# Patient Record
Sex: Male | Born: 1995 | Race: Black or African American | Hispanic: No | Marital: Single | Smoking: Never smoker
Health system: Southern US, Community
[De-identification: ages and names within clinical notes are randomized; demographics above are authoritative.]

## PROBLEM LIST (undated history)

## (undated) DIAGNOSIS — F32A Depression, unspecified: Secondary | ICD-10-CM

## (undated) DIAGNOSIS — F419 Anxiety disorder, unspecified: Secondary | ICD-10-CM

## (undated) DIAGNOSIS — J45909 Unspecified asthma, uncomplicated: Secondary | ICD-10-CM

## (undated) DIAGNOSIS — F329 Major depressive disorder, single episode, unspecified: Secondary | ICD-10-CM

---

## 2016-09-10 ENCOUNTER — Encounter (HOSPITAL_COMMUNITY): Payer: Self-pay | Admitting: Emergency Medicine

## 2016-09-10 ENCOUNTER — Emergency Department (HOSPITAL_COMMUNITY): Payer: Self-pay

## 2016-09-10 ENCOUNTER — Emergency Department (HOSPITAL_COMMUNITY)
Admission: EM | Admit: 2016-09-10 | Discharge: 2016-09-10 | Disposition: A | Discharge status: Home / Self Care | Payer: Self-pay | Attending: Emergency Medicine | Admitting: Emergency Medicine

## 2016-09-10 DIAGNOSIS — Y999 Unspecified external cause status: Secondary | ICD-10-CM | POA: Insufficient documentation

## 2016-09-10 DIAGNOSIS — Z23 Encounter for immunization: Secondary | ICD-10-CM | POA: Insufficient documentation

## 2016-09-10 DIAGNOSIS — J45909 Unspecified asthma, uncomplicated: Secondary | ICD-10-CM | POA: Insufficient documentation

## 2016-09-10 DIAGNOSIS — W228XXA Striking against or struck by other objects, initial encounter: Secondary | ICD-10-CM | POA: Insufficient documentation

## 2016-09-10 DIAGNOSIS — Y9389 Activity, other specified: Secondary | ICD-10-CM | POA: Insufficient documentation

## 2016-09-10 DIAGNOSIS — S60221A Contusion of right hand, initial encounter: Secondary | ICD-10-CM | POA: Insufficient documentation

## 2016-09-10 DIAGNOSIS — Y9289 Other specified places as the place of occurrence of the external cause: Secondary | ICD-10-CM | POA: Insufficient documentation

## 2016-09-10 HISTORY — DX: Anxiety disorder, unspecified: F41.9

## 2016-09-10 HISTORY — DX: Major depressive disorder, single episode, unspecified: F32.9

## 2016-09-10 HISTORY — DX: Depression, unspecified: F32.A

## 2016-09-10 HISTORY — DX: Unspecified asthma, uncomplicated: J45.909

## 2016-09-10 MED ORDER — OXYCODONE-ACETAMINOPHEN 5-325 MG PO TABS
1.0000 | ORAL_TABLET | Freq: Once | ORAL | Status: AC
  Administered 2016-09-10: 1 via ORAL
  Filled 2016-09-10: qty 1

## 2016-09-10 MED ORDER — TETANUS-DIPHTH-ACELL PERTUSSIS 5-2.5-18.5 LF-MCG/0.5 IM SUSP
0.5000 mL | Freq: Once | INTRAMUSCULAR | Status: AC
  Administered 2016-09-10: 0.5 mL via INTRAMUSCULAR
  Filled 2016-09-10: qty 0.5

## 2016-09-10 MED ORDER — HYDROCODONE-ACETAMINOPHEN 5-325 MG PO TABS: 1.0000 | ORAL_TABLET | Freq: Four times a day (QID) | ORAL | 0 refills | Status: DC | PRN

## 2016-09-10 NOTE — Progress Notes (Signed)
Orthopedic Tech Progress Note Patient Details:  Juan Chen 06/29/1995 [id number]  Ortho Devices Type of Ortho Device: Ace wrap, Volar splint Ortho Device/Splint Location: RUE Ortho Device/Splint Interventions: Ordered, Application   Jennye MoccasinHughes, Kein Carlberg Craig 09/10/2016, 10:05 PM

## 2016-09-10 NOTE — ED Triage Notes (Signed)
Pt presents with injury to RIGHT hand after punching brick wall x 2 just PTA; Pt arrived by [facility] who splinted extremity and applied ice enroute; PMS intact distally; small abrasions noted, bleeding controlled

## 2016-09-10 NOTE — ED Notes (Signed)
Patient transported to X-ray 

## 2016-09-10 NOTE — ED Provider Notes (Signed)
[facility]-EMERGENCY DEPT Provider Note   CSN: [id number] Arrival date & time: 09/10/16  1948   By signing my name below, I, Juan Chen, attest that this documentation has been prepared under the direction and in the presence of  Juan Mornavid Jaretzi Droz, NP. Electronically Signed: Clovis PuAvnee Chen, ED Scribe. 09/10/16. 8:47 PM.   History   Chief Complaint Chief Complaint  Patient presents with  . Hand Injury    HPI Comments:  Juan Chen is a 21 y.o. male who presents to the Emergency Department complaining of acute onset, moderate right hand pain s/p an incident which occurred 2 hours PTA. Pt states he punched a brick wall. His pain is worse with movement of his fingers and upon palpation. Pt also has associated abrasions to his hand. No alleviating factors noted. Pt denies right wrist pain or any other associated symptoms. He also denies a hx of injuries to the same hand. Pt is right handed. Tetanus status unknown.   The history is provided by the patient. No language interpreter was used.  Hand Injury   The incident occurred 1 to 2 hours ago. The injury mechanism was a direct blow. The pain is present in the right hand. The pain is moderate. The pain has been constant since the incident. He reports no foreign bodies present. The symptoms are aggravated by movement and palpation. He has tried nothing for the symptoms.    Past Medical History:  Diagnosis Date  . Anxiety and depression   . Asthma     There are no active problems to display for this patient.   History reviewed. No pertinent surgical history.     Home Medications    Prior to Admission medications   Not on File    Family History History reviewed. No pertinent family history.  Social History Social History  Substance Use Topics  . Smoking status: Never Smoker  . Smokeless tobacco: Never Used  . Alcohol use No     Allergies   Penicillins   Review of Systems Review of Systems  Musculoskeletal: Positive for  myalgias.  Skin: Positive for wound.  Neurological: Negative for numbness.  All other systems reviewed and are negative.    Physical Exam Updated Vital Signs BP 115/78 (BP Location: Right Arm)   Pulse 73   Temp 98.4 F (36.9 C) (Oral)   Resp 16   Ht 5\' 10"  (1.778 m)   Wt 150 lb (68 kg)   SpO2 100%   BMI 21.52 kg/m   Physical Exam  Constitutional: He is oriented to person, place, and time. He appears well-developed and well-nourished.  HENT:  Head: Normocephalic and atraumatic.  Eyes: EOM are normal.  Neck: Normal range of motion.  Cardiovascular: Normal rate, regular rhythm, normal heart sounds and intact distal pulses.   Pulmonary/Chest: Effort normal and breath sounds normal. No respiratory distress.  Abdominal: Soft. He exhibits no distension. There is no tenderness.  Musculoskeletal: Normal range of motion. He exhibits tenderness.  Tenderness across 3rd, 4th and 5th MCP joints Abrasions overlying the MCP joints.   Neurological: He is alert and oriented to person, place, and time.  Skin: Skin is warm and dry.  Psychiatric: He has a normal mood and affect. Judgment normal.  Nursing note and vitals reviewed.    ED Treatments / Results  DIAGNOSTIC STUDIES:  Oxygen Saturation is 100% on RA, normal by my interpretation.    COORDINATION OF CARE:  8:40 PM Discussed treatment plan with pt at bedside and pt  agreed to plan.  Labs (all labs ordered are listed, but only abnormal results are displayed) Labs Reviewed - No data to display  EKG  EKG Interpretation None       Radiology Dg Hand Complete Right  Result Date: 09/10/2016 CLINICAL DATA:  Pain across the third through fifth metacarpal phalangeal joints after punching wall. EXAM: RIGHT HAND - COMPLETE 3+ VIEW COMPARISON:  None. FINDINGS: There is no evidence of fracture or dislocation. Carpal rows are maintained. Mild soft tissue swelling over the MCP joints. There is no evidence of arthropathy. Tiny sclerotic  density involving the tuft of the third distal phalanx consistent with a bone island. IMPRESSION: Mild soft tissue swelling involving the dorsum of the hand at the level of the MCP. No acute fracture identified. Electronically Signed   By: Tollie Eth M.D.   On: 09/10/2016 20:38    Procedures Procedures (including critical care time)  Medications Ordered in ED Medications  oxyCODONE-acetaminophen (PERCOCET/ROXICET) 5-325 MG per tablet 1 tablet (1 tablet Oral Given 09/10/16 2057)  Tdap (BOOSTRIX) injection 0.5 mL (0.5 mLs Intramuscular Given 09/10/16 2058)     Initial Impression / Assessment and Plan / ED Course  I have reviewed the triage vital signs and the nursing notes.  Pertinent labs & imaging results that were available during my care of the patient were reviewed by me and considered in my medical decision making (see chart for details).    Tetanus updated.  Patient X-Ray negative for obvious fracture or dislocation.  Pt advised to follow up with hand. Patient given splint while in ED, conservative therapy recommended and discussed. Patient will be discharged home & is agreeable with above plan. Returns precautions discussed. Pt appears safe for discharge.  Final Clinical Impressions(s) / ED Diagnoses   Final diagnoses:  Contusion of right hand, initial encounter    New Prescriptions New Prescriptions   HYDROCODONE-ACETAMINOPHEN (NORCO/VICODIN) 5-325 MG TABLET    Take 1 tablet by mouth every 6 (six) hours as needed for severe pain.    I personally performed the services described in this documentation, which was scribed in my presence. The recorded information has been reviewed and is accurate.     Juan Morn, NP 09/10/16 4098    Tilden Fossa, MD 09/11/16 [phone]

## 2016-09-12 ENCOUNTER — Emergency Department (HOSPITAL_COMMUNITY)
Admission: EM | Admit: 2016-09-12 | Discharge: 2016-09-12 | Disposition: A | Discharge status: Home / Self Care | Payer: Self-pay | Attending: Emergency Medicine | Admitting: Emergency Medicine

## 2016-09-12 DIAGNOSIS — S60221S Contusion of right hand, sequela: Secondary | ICD-10-CM

## 2016-09-12 DIAGNOSIS — J45909 Unspecified asthma, uncomplicated: Secondary | ICD-10-CM | POA: Insufficient documentation

## 2016-09-12 DIAGNOSIS — W2201XD Walked into wall, subsequent encounter: Secondary | ICD-10-CM | POA: Insufficient documentation

## 2016-09-12 DIAGNOSIS — S60221D Contusion of right hand, subsequent encounter: Secondary | ICD-10-CM | POA: Insufficient documentation

## 2016-09-12 NOTE — ED Triage Notes (Signed)
Per EMS, pt punched a wall 2 days ago and received a splint. Pt was got his splint caught on a swing and needs a new splint.

## 2016-09-12 NOTE — ED Provider Notes (Signed)
[facility]-EMERGENCY DEPT Provider Note   CSN: [id number] Arrival date & time: 09/12/16  1728     History   Chief Complaint Chief Complaint  Patient presents with  . needs new splint    HPI Juan Chen is a 21 y.o. male.  HPI Juan Chen is a 21 y.o. male presents to emergency department complaining of right hand pain. Patient states that he punched a wall 2 days ago and was seen here for right hand injury at that time. His x-ray was negative. He was given a Velcro splint and an Ace wrap and was discharged home with conservative treatment. Patient states he was at the bar and states his splint and Ace wrap got "caught on something and tripped." He is requesting another Ace wrap and splint. He states he is not having any pain in his wrist, only in his hand. Pain with movement of his fingers. Denies any new injuries.  Past Medical History:  Diagnosis Date  . Anxiety and depression   . Asthma     There are no active problems to display for this patient.   No past surgical history on file.     Home Medications    Prior to Admission medications   Medication Sig Start Date End Date Taking? Authorizing Provider  HYDROcodone-acetaminophen (NORCO/VICODIN) 5-325 MG tablet Take 1 tablet by mouth every 6 (six) hours as needed for severe pain. 09/10/16   Felicie Morn, NP    Family History No family history on file.  Social History Social History  Substance Use Topics  . Smoking status: Never Smoker  . Smokeless tobacco: Never Used  . Alcohol use No     Allergies   Penicillins   Review of Systems Review of Systems  Musculoskeletal: Positive for arthralgias.  Skin: Positive for wound.  Neurological: Negative for weakness and numbness.  All other systems reviewed and are negative.    Physical Exam Updated Vital Signs BP 112/76 (BP Location: Left Arm)   Pulse 85   Temp 98.2 F (36.8 C) (Oral)   Resp 20   Ht 5\' 10"  (1.778 m)   Wt 68 kg   SpO2 100%   BMI  21.52 kg/m   Physical Exam  Constitutional: He appears well-developed and well-nourished. No distress.  Eyes: Conjunctivae are normal.  Neck: Neck supple.  Cardiovascular: Normal rate.   Pulmonary/Chest: No respiratory distress.  Abdominal: He exhibits no distension.  Musculoskeletal:  No obvious swelling noted to the right hand. There are several superficial abrasions to the dorsal hand and knuckles. There is no tenderness over the wrist joint. Full range of motion of the wrist. Tenderness to palpation over the third, fourth, fifth metacarpals and MCP joints. Pain with range of motion of the fingers at the third, fourth, fifth MCP joints. Distal radial pulses intact. Capillary refill less than 2 seconds distally.  Skin: Skin is warm and dry.  Nursing note and vitals reviewed.    ED Treatments / Results  Labs (all labs ordered are listed, but only abnormal results are displayed) Labs Reviewed - No data to display  EKG  EKG Interpretation None       Radiology Dg Hand Complete Right  Result Date: 09/10/2016 CLINICAL DATA:  Pain across the third through fifth metacarpal phalangeal joints after punching wall. EXAM: RIGHT HAND - COMPLETE 3+ VIEW COMPARISON:  None. FINDINGS: There is no evidence of fracture or dislocation. Carpal rows are maintained. Mild soft tissue swelling over the MCP joints. There is  no evidence of arthropathy. Tiny sclerotic density involving the tuft of the third distal phalanx consistent with a bone island. IMPRESSION: Mild soft tissue swelling involving the dorsum of the hand at the level of the MCP. No acute fracture identified. Electronically Signed   By: Tollie Ethavid  Kwon M.D.   On: 09/10/2016 20:38    Procedures Procedures (including critical care time)  Medications Ordered in ED Medications - No data to display   Initial Impression / Assessment and Plan / ED Course  I have reviewed the triage vital signs and the nursing notes.  Pertinent labs & imaging  results that were available during my care of the patient were reviewed by me and considered in my medical decision making (see chart for details).     Patient was seen in image for the same 2 days ago, states he ripped his splint and Ace wrap and requesting new. I do not think he needs a splint, he is not having any wrist complaints. He is only having pain to the third, fourth, fifth metacarpals and MCP joints. His x-ray was reviewed and is negative. Will place in another Ace wrap, advised to continue to keep elevated, ibuprofen for pain, avoid bars. Follow-up with family doctor as needed.  Vitals:   09/12/16 1731  BP: 112/76  Pulse: 85  Resp: 20  Temp: 98.2 F (36.8 C)  TempSrc: Oral  SpO2: 100%  Weight: 68 kg  Height: 5\' 10"  (1.778 m)     Final Clinical Impressions(s) / ED Diagnoses   Final diagnoses:  Contusion of right hand, sequela    New Prescriptions New Prescriptions   No medications on file     Jaynie Crumbleatyana Paytience Bures, PA-C 09/12/16 1803    Lavera Guiseana Duo Liu, MD 09/13/16 1254

## 2016-09-12 NOTE — ED Notes (Signed)
Bed: WTR8 Expected date:  Expected time:  Means of arrival:  Comments: EMS hand pain

## 2016-09-12 NOTE — Discharge Instructions (Signed)
Ice and elevate your hand. ACE wrap as needed. Follow up with family doctor as needed.

## 2016-12-30 ENCOUNTER — Emergency Department (HOSPITAL_COMMUNITY)
Admission: EM | Admit: 2016-12-30 | Discharge: 2016-12-31 | Disposition: A | Discharge status: Home / Self Care | Payer: Self-pay | Attending: Emergency Medicine | Admitting: Emergency Medicine

## 2016-12-30 DIAGNOSIS — F4325 Adjustment disorder with mixed disturbance of emotions and conduct: Secondary | ICD-10-CM | POA: Insufficient documentation

## 2016-12-30 DIAGNOSIS — J45909 Unspecified asthma, uncomplicated: Secondary | ICD-10-CM | POA: Insufficient documentation

## 2016-12-30 LAB — CBC WITH DIFFERENTIAL/PLATELET
Basophils Absolute: 0 10*3/uL (ref 0.0–0.1)
Basophils Relative: 0 %
EOS ABS: 0 10*3/uL (ref 0.0–0.7)
EOS PCT: 1 %
HCT: 41.3 % (ref 39.0–52.0)
Hemoglobin: 14.9 g/dL (ref 13.0–17.0)
LYMPHS ABS: 1.8 10*3/uL (ref 0.7–4.0)
Lymphocytes Relative: 36 %
MCH: 31.2 pg (ref 26.0–34.0)
MCHC: 36.1 g/dL — AB (ref 30.0–36.0)
MCV: 86.4 fL (ref 78.0–100.0)
MONOS PCT: 6 %
Monocytes Absolute: 0.3 10*3/uL (ref 0.1–1.0)
Neutro Abs: 2.8 10*3/uL (ref 1.7–7.7)
Neutrophils Relative %: 57 %
PLATELETS: 173 10*3/uL (ref 150–400)
RBC: 4.78 MIL/uL (ref 4.22–5.81)
RDW: 12.2 % (ref 11.5–15.5)
WBC: 4.9 10*3/uL (ref 4.0–10.5)

## 2016-12-30 LAB — COMPREHENSIVE METABOLIC PANEL
ALT: 8 U/L — ABNORMAL LOW (ref 17–63)
AST: 19 U/L (ref 15–41)
Albumin: 5.5 g/dL — ABNORMAL HIGH (ref 3.5–5.0)
Alkaline Phosphatase: 84 U/L (ref 38–126)
Anion gap: 9 (ref 5–15)
BUN: 13 mg/dL (ref 6–20)
CHLORIDE: 106 mmol/L (ref 101–111)
CO2: 26 mmol/L (ref 22–32)
Calcium: 9.9 mg/dL (ref 8.9–10.3)
Creatinine, Ser: 1.05 mg/dL (ref 0.61–1.24)
GFR calc Af Amer: >60 mL/min (ref >60)
Glucose, Bld: 89 mg/dL (ref 65–99)
POTASSIUM: 4.1 mmol/L (ref 3.5–5.1)
Sodium: 141 mmol/L (ref 135–145)
Total Bilirubin: 2.2 mg/dL — ABNORMAL HIGH (ref 0.3–1.2)
Total Protein: 8.3 g/dL — ABNORMAL HIGH (ref 6.5–8.1)

## 2016-12-30 LAB — RAPID URINE DRUG SCREEN, HOSP PERFORMED
AMPHETAMINES: NOT DETECTED
BENZODIAZEPINES: NOT DETECTED
Barbiturates: NOT DETECTED
Cocaine: NOT DETECTED
OPIATES: NOT DETECTED
Tetrahydrocannabinol: POSITIVE — AB

## 2016-12-30 LAB — SALICYLATE LEVEL: Salicylate Lvl: <7 mg/dL (ref 2.8–30.0)

## 2016-12-30 LAB — ACETAMINOPHEN LEVEL

## 2016-12-30 LAB — ETHANOL

## 2016-12-30 MED ORDER — ONDANSETRON HCL 4 MG PO TABS: 4.0000 mg | ORAL_TABLET | Freq: Three times a day (TID) | ORAL | Status: DC | PRN

## 2016-12-30 MED ORDER — IBUPROFEN 200 MG PO TABS: 600.0000 mg | ORAL_TABLET | Freq: Three times a day (TID) | ORAL | Status: DC | PRN

## 2016-12-30 MED ORDER — ZOLPIDEM TARTRATE 5 MG PO TABS: 5.0000 mg | ORAL_TABLET | Freq: Every evening | ORAL | Status: DC | PRN

## 2016-12-30 MED ORDER — ALUM & MAG HYDROXIDE-SIMETH 200-200-20 MG/5ML PO SUSP: 30.0000 mL | Freq: Four times a day (QID) | ORAL | Status: DC | PRN

## 2016-12-30 MED ORDER — NICOTINE 21 MG/24HR TD PT24: 21.0000 mg | MEDICATED_PATCH | Freq: Every day | TRANSDERMAL | Status: DC

## 2016-12-30 NOTE — ED Provider Notes (Signed)
[facility] Provider Note   CSN: [id number] Arrival date & time: 12/30/16  1622     History   Chief Complaint Chief Complaint  Patient presents with  . IVC    HPI Juan Chen is a 21 y.o. male.  HPI  21 year old male with a history of bipolar disorder presents under police custody with IVC paperwork. Patient's mom wrote the IVC.  Patient states that all of the information and there is false and that they had a fight last night and that she was intoxicated and that is why she wrote those things on the IVC paperwork. He denies feeling suicidal, he denies hearing voices or seeing things, and denies feeling ill at all. The IVC paperwork indicates that the patient has been off his meds for quite some time which she does admit to but states that he does not need them. It also indicates that he was threatening both himself and her with a knife which he denies. Endorses auditory and visual hallucinations which he also denies. Otherwise he denies being sick.  Past Medical History:  Diagnosis Date  . Anxiety and depression   . Asthma     There are no active problems to display for this patient.   No past surgical history on file.     Home Medications    Prior to Admission medications   Medication Sig Start Date End Date Taking? Authorizing Provider  HYDROcodone-acetaminophen (NORCO/VICODIN) 5-325 MG tablet Take 1 tablet by mouth every 6 (six) hours as needed for severe pain. Patient not taking: Reported on 12/30/2016 09/10/16   Felicie Morn, NP    Family History No family history on file.  Social History Social History  Substance Use Topics  . Smoking status: Never Smoker  . Smokeless tobacco: Never Used  . Alcohol use No     Allergies   Penicillins   Review of Systems Review of Systems  Constitutional: Negative for fever.  Gastrointestinal: Negative for vomiting.  Psychiatric/Behavioral: Negative for self-injury and suicidal ideas.  All other  systems reviewed and are negative.    Physical Exam Updated Vital Signs BP 115/65 (BP Location: Left Arm)   Pulse 83   Temp 98.7 F (37.1 C) (Oral)   Resp 18   Ht 5\' 10"  (1.778 m)   Wt 70.3 kg (155 lb)   SpO2 99%   BMI 22.24 kg/m   Physical Exam  Constitutional: He is oriented to person, place, and time. He appears well-developed and well-nourished.  HENT:  Head: Normocephalic and atraumatic.  Right Ear: External ear normal.  Left Ear: External ear normal.  Nose: Nose normal.  Eyes: Right eye exhibits no discharge. Left eye exhibits no discharge.  Neck: Neck supple.  Cardiovascular: Normal rate, regular rhythm and normal heart sounds.   Pulmonary/Chest: Effort normal and breath sounds normal.  Abdominal: Soft. There is no tenderness.  Musculoskeletal: He exhibits no edema.  Neurological: He is alert and oriented to person, place, and time.  Skin: Skin is warm and dry.  Psychiatric: He has a normal mood and affect. His speech is normal and behavior is normal. He is not actively hallucinating. He expresses no homicidal and no suicidal ideation.  Nursing note and vitals reviewed.    ED Treatments / Results  Labs (all labs ordered are listed, but only abnormal results are displayed) Labs Reviewed  COMPREHENSIVE METABOLIC PANEL - Abnormal; Notable for the following:       Result Value   Total Protein 8.3 (*)  Albumin 5.5 (*)    ALT 8 (*)    Total Bilirubin 2.2 (*)    All other components within normal limits  RAPID URINE DRUG SCREEN, HOSP PERFORMED - Abnormal; Notable for the following:    Tetrahydrocannabinol POSITIVE (*)    All other components within normal limits  CBC WITH DIFFERENTIAL/PLATELET - Abnormal; Notable for the following:    MCHC 36.1 (*)    All other components within normal limits  ACETAMINOPHEN LEVEL - Abnormal; Notable for the following:    Acetaminophen (Tylenol), Serum <10 (*)    All other components within normal limits  ETHANOL  SALICYLATE  LEVEL    EKG  EKG Interpretation None       Radiology No results found.  Procedures Procedures (including critical care time)  Medications Ordered in ED Medications  ibuprofen (ADVIL,MOTRIN) tablet 600 mg (not administered)  zolpidem (AMBIEN) tablet 5 mg (not administered)  ondansetron (ZOFRAN) tablet 4 mg (not administered)  alum & mag hydroxide-simeth (MAALOX/MYLANTA) 200-200-20 MG/5ML suspension 30 mL (not administered)  nicotine (NICODERM CQ - dosed in mg/24 hours) patch 21 mg (21 mg Transdermal Refused 12/30/16 1836)     Initial Impression / Assessment and Plan / ED Course  I have reviewed the triage vital signs and the nursing notes.  Pertinent labs & imaging results that were available during my care of the patient were reviewed by me and considered in my medical decision making (see chart for details).     Patient is calm, no acute distress. Does not appear psychotic. Screening labs unremarkable. VS stable. Consult tts, psych to see in AM.   Final Clinical Impressions(s) / ED Diagnoses   Final diagnoses:  Involuntary commitment    New Prescriptions New Prescriptions   No medications on file     Pricilla LovelessGoldston, Lovette Merta, MD 12/31/16 [phone]

## 2016-12-30 NOTE — ED Triage Notes (Signed)
Per IVC paper work-states he has a history of bi polar disorder-states he has not been taking meds-tried to hurt himself last night-patient states it is a misunderstanding between he and his mom-patient states he gets mad and cusses a lot and his mom gets scared-denies Si/HI/AI-states he is going to school and hangs out with girls-doesn't think he needs to be here

## 2016-12-30 NOTE — [facility] Assessment (Signed)
[facility] Assessment Progress Note   Case was staffed with Lord DNP who recommended patient be re-evaluated in the a.m.    

## 2016-12-30 NOTE — ED Notes (Signed)
Pt admitted to room # 43. Pt irritable on approach, sad affect. Pt denies SI/HI/AVH. Pt blaming mother for being at the hospital. Encouragement and support provided. Special checks q 15 mins in place for safety, Video monitoring in place. Will continue to monitor.

## 2016-12-30 NOTE — BH Assessment (Addendum)
Assessment Note  Juan Chen is an 21 y.o. male that presents this date under IVC. Per IVC: "Respondent has been diagnosed with Bipolar disorder, Depression and Schizophrenia. The respondent is not currently taking any medications. Last night the respondent got a knife and tried to cut himself. The respondent told the petitioner he is going to kill himself and he is going to kill her (mother). The respondent advised the petitioner that he hears voices and the voices tell him to do things. For instance the respondent put the petitioner's cake in the commode then he put her garlic bread in the sink, then back in the pan." Patient denies all the contents of the IVC and presents with a pleasant affect as he interacts with this Clinical research associate. Patient is oriented to time/place and denies any S/I, H/I or AVH. Patient denies any history of self injurious behaviors. Patient states his mother was upset with him because he informed her that she "drank to much." Patient stated the mother actually took a knife out last night and "waved it in the air." Patient stated he became upset and "broke all the knives in the house." Patient denies any previous attempts/gestures at self harm but does report one inpatient admission in [place] in 2017 for depression. Patient reports they did not prescribe any medication on discharge and states he has not been on medications since that admission. Patient states he never was diagnosed with any other MH disorder. Patient denies any current symptoms. Patient does report ongoing Cannabis use stating he smokes 1 gram two to three times a week with last reported use on 12/29/16 when patient stated he smoked 1 gram. Patient states he currently resides with his mother Juan Chen) and they had relocated to [facility] 7 months ago so he could attend [employer]. Per note review, patient states it is a misunderstanding between he and his mother, patient states he gets mad and yells a lot and his mom gets  scared. Patient denies SI/, HI, or AVH. Case was staffed with Shaune Pollack DNP who recommended patient be re-evaluated in the a.m.  Diagnosis: MDD recurrent without psychotic features (per note review) Cannabis use  Past Medical History:  Past Medical History:  Diagnosis Date  . Anxiety and depression   . Asthma     No past surgical history on file.  Family History: No family history on file.  Social History:  reports that he has never smoked. He has never used smokeless tobacco. He reports that he does not drink alcohol or use drugs.  Additional Social History:  Alcohol / Drug Use Pain Medications: See MAR Prescriptions: See MAR Over the Counter: See MAR History of alcohol / drug use?: Yes Longest period of sobriety (when/how long): Unknown Negative Consequences of Use:  (Denies) Withdrawal Symptoms:  (Denies) Substance #1 Name of Substance 1: Cannabis 1 - Age of First Use: 17 1 - Amount (size/oz): 1 gram 1 - Frequency: Three to four times a week 1 - Duration: Last year 1 - Last Use / Amount: 12/29/16 1 gram  CIWA: CIWA-Ar BP: 115/65 Pulse Rate: 83 COWS:    Allergies:  Allergies  Allergen Reactions  . Penicillins Swelling    Home Medications:  (Not in a hospital admission)  OB/GYN Status:  No LMP for male patient.  General Assessment Data Location of Assessment: [facility] TTS Assessment: In system Is this a Tele or Face-to-Face Assessment?: Face-to-Face Is this an Initial Assessment or a Re-assessment for this encounter?: Initial Assessment Marital  status: Single Maiden name: NA Is patient pregnant?: No Pregnancy Status: No Living Arrangements: Parent Can pt return to current living arrangement?: Yes Admission Status: Involuntary Is patient capable of signing voluntary admission?: Yes Referral Source: Self/Family/Friend Insurance type: Self Pay  Medical Screening Exam [facility] Walk-in ONLY) Medical Exam completed: Yes  Crisis Care Plan Living Arrangements:  Parent Legal Guardian:  (NA) Name of Psychiatrist: None Name of Therapist: None  Education Status Is patient currently in school?: Yes Current Grade:  (1st year college) Highest grade of school patient has completed:  (12th grade) Name of school:  ([employer]) Contact person: NA  Risk to self with the past 6 months Suicidal Ideation: Yes-Currently Present ( Per IVC) Has patient been a risk to self within the past 6 months prior to admission? : No Suicidal Intent: No Has patient had any suicidal intent within the past 6 months prior to admission? : No Is patient at risk for suicide?: No Suicidal Plan?: No Has patient had any suicidal plan within the past 6 months prior to admission? : No Access to Means: Yes (Per IVC pt had knife) Specify Access to Suicidal Means: Pt had knide per IVC What has been your use of drugs/alcohol within the last 12 months?: Current use Previous Attempts/Gestures: No How many times?: 0 Other Self Harm Risks: NA Triggers for Past Attempts: Unknown Intentional Self Injurious Behavior: None Family Suicide History: No Recent stressful life event(s): Other (Comment) Persecutory voices/beliefs?: No Depression: No Depression Symptoms:  (NA) Substance abuse history and/or treatment for substance abuse?: No Suicide prevention information given to non-admitted patients: Not applicable  Risk to Others within the past 6 months Homicidal Ideation: No Does patient have any lifetime risk of violence toward others beyond the six months prior to admission? : No Thoughts of Harm to Others: No Current Homicidal Intent: No Current Homicidal Plan: No Access to Homicidal Means: No Identified Victim: NA History of harm to others?: No Assessment of Violence: None Noted Violent Behavior Description: NA Does patient have access to weapons?: No Criminal Charges Pending?: No Does patient have a court date: No Is patient on probation?: No  Psychosis Hallucinations: None  noted Delusions: None noted  Mental Status Report Appearance/Hygiene: In scrubs Eye Contact: Good Motor Activity: Freedom of movement Speech: Logical/coherent Level of Consciousness: Alert Mood: Pleasant Affect: Appropriate to circumstance Anxiety Level: Minimal Thought Processes: Coherent, Relevant Judgement: Unimpaired Orientation: Person, Place, Time Obsessive Compulsive Thoughts/Behaviors: None  Cognitive Functioning Concentration: Normal Memory: Recent Intact, Remote Intact IQ: Average Insight: Good Impulse Control: Fair Appetite: Good Weight Loss: 0 Weight Gain: 0 Sleep: No Change Total Hours of Sleep: 8 Vegetative Symptoms: None  ADLScreening [facility] Assessment Services) Patient's cognitive ability adequate to safely complete daily activities?: Yes Patient able to express need for assistance with ADLs?: Yes Independently performs ADLs?: Yes (appropriate for developmental age)  Prior Inpatient Therapy Prior Inpatient Therapy: Yes Prior Therapy Dates: 2017 [facility] Reason for Treatment: Depression  Prior Outpatient Therapy Prior Outpatient Therapy: No Prior Therapy Dates: NA Prior Therapy Facilty/Provider(s): NA Reason for Treatment: NA Does patient have an ACCT team?: No Does patient have Intensive In-House Services?  : No Does patient have [facility] services? : No Does patient have P4CC services?: No  ADL Screening (condition at time of admission) Patient's cognitive ability adequate to safely complete daily activities?: Yes Is the patient deaf or have difficulty hearing?: No Does the patient have difficulty seeing, even when wearing glasses/contacts?: No Does the patient have  difficulty concentrating, remembering, or making decisions?: No Patient able to express need for assistance with ADLs?: Yes Does the patient have difficulty dressing or bathing?: No Independently performs ADLs?: Yes (appropriate for developmental  age) Does the patient have difficulty walking or climbing stairs?: No Weakness of Legs: None Weakness of Arms/Hands: None  Home Assistive Devices/Equipment Home Assistive Devices/Equipment: None  Therapy Consults (therapy consults require a physician order) PT Evaluation Needed: No OT Evalulation Needed: No SLP Evaluation Needed: No Abuse/Neglect Assessment (Assessment to be complete while patient is alone) Physical Abuse: Denies Verbal Abuse: Denies Sexual Abuse: Denies Exploitation of patient/patient's resources: Denies Self-Neglect: Denies Values / Beliefs Cultural Requests During Hospitalization: None Spiritual Requests During Hospitalization: None Consults Spiritual Care Consult Needed: No Social Work Consult Needed: No Merchant navy officer (For Healthcare) Does Patient Have a Medical Advance Directive?: No Would patient like information on creating a medical advance directive?: No - Patient declined    Additional Information 1:1 In Past 12 Months?: No CIRT Risk: No Elopement Risk: No Does patient have medical clearance?: Yes     Disposition: Case was staffed with Shaune Pollack DNP who reccommended patient be re-evaluated in the a.m. Disposition Initial Assessment Completed for this Encounter: Yes Disposition of Patient: Other dispositions Other disposition(s): Other (Comment) (Re-evaluate in the a.m.)  On Site Evaluation by:   Reviewed with Physician:    Alfredia Ferguson 12/30/2016 6:24 PM

## 2016-12-31 DIAGNOSIS — F4325 Adjustment disorder with mixed disturbance of emotions and conduct: Secondary | ICD-10-CM

## 2016-12-31 NOTE — Consult Note (Signed)
[facility] Face-to-Face Psychiatry Consult   Reason for Consult:  Verbal altercation with his mother who sent him to the ED Referring Physician:  EDP Patient Identification: BRACKEN MOFFA MRN:  [id number] Principal Diagnosis: Adjustment disorder with mixed disturbance of emotions and conduct Diagnosis:   Patient Active Problem List   Diagnosis Date Noted  . Adjustment disorder with mixed disturbance of emotions and conduct [F43.25] 12/31/2016    Priority: High    Total Time spent with patient: 45 minutes  Subjective:   Juan Chen is a 21 y.o. male patient does not warrant admission.  HPI:  21 yo male who got into a verbal altercation with his mother last night and she sent him here.  No suicidal/homicidal ideations, hallucinations, or alcohol/drug abuse.  He is planning on moving back to live with his father in [place] on Wednesday.  He was attending [employer].  Reports a bipolar diagnosis but does not take any medications for this nor is he planning on this.  No mania or depression noted, stable for discharge.  Past Psychiatric History: bipolar disorder  Risk to Self: NOne Risk to Others: Homicidal Ideation: No Thoughts of Harm to Others: No Current Homicidal Intent: No Current Homicidal Plan: No Access to Homicidal Means: No Identified Victim: NA History of harm to others?: No Assessment of Violence: None Noted Violent Behavior Description: NA Does patient have access to weapons?: No Criminal Charges Pending?: No Does patient have a court date: No Prior Inpatient Therapy: Prior Inpatient Therapy: Yes Prior Therapy Dates: 2017 Prior Therapy Facilty/Provider(s): [place] [facility] Reason for Treatment: Depression Prior Outpatient Therapy: Prior Outpatient Therapy: No Prior Therapy Dates: NA Prior Therapy Facilty/Provider(s): NA Reason for Treatment: NA Does patient have an ACCT team?: No Does patient have Intensive In-House Services?  : No Does patient have [facility] services? :  No Does patient have P4CC services?: No  Past Medical History:  Past Medical History:  Diagnosis Date  . Anxiety and depression   . Asthma    No past surgical history on file. Family History: No family history on file. Family Psychiatric  History: none Social History:  History  Alcohol Use No     History  Drug Use No    Social History   Social History  . Marital status: Single    Spouse name: N/A  . Number of children: N/A  . Years of education: N/A   Social History Main Topics  . Smoking status: Never Smoker  . Smokeless tobacco: Never Used  . Alcohol use No  . Drug use: No  . Sexual activity: Not on file   Other Topics Concern  . Not on file   Social History Narrative  . No narrative on file   Additional Social History:    Allergies:   Allergies  Allergen Reactions  . Penicillins Swelling    Labs:  Results for orders placed or performed during the hospital encounter of 12/30/16 (from the past 48 hour(s))  Urine rapid drug screen (hosp performed)     Status: Abnormal   Collection Time: 12/30/16  4:35 PM  Result Value Ref Range   Opiates NONE DETECTED NONE DETECTED   Cocaine NONE DETECTED NONE DETECTED   Benzodiazepines NONE DETECTED NONE DETECTED   Amphetamines NONE DETECTED NONE DETECTED   Tetrahydrocannabinol POSITIVE (A) NONE DETECTED   Barbiturates NONE DETECTED NONE DETECTED    Comment:        DRUG SCREEN FOR MEDICAL PURPOSES ONLY.  IF CONFIRMATION IS NEEDED FOR ANY  PURPOSE, NOTIFY LAB WITHIN 5 DAYS.        LOWEST DETECTABLE LIMITS FOR URINE DRUG SCREEN Drug Class       Cutoff (ng/mL) Amphetamine      1000 Barbiturate      200 Benzodiazepine   657 Tricyclics       846 Opiates          300 Cocaine          300 THC              50   Comprehensive metabolic panel     Status: Abnormal   Collection Time: 12/30/16  5:54 PM  Result Value Ref Range   Sodium 141 135 - 145 mmol/L   Potassium 4.1 3.5 - 5.1 mmol/L   Chloride 106 101 - 111  mmol/L   CO2 26 22 - 32 mmol/L   Glucose, Bld 89 65 - 99 mg/dL   BUN 13 6 - 20 mg/dL   Creatinine, Ser 1.05 0.61 - 1.24 mg/dL   Calcium 9.9 8.9 - 10.3 mg/dL   Total Protein 8.3 (H) 6.5 - 8.1 g/dL   Albumin 5.5 (H) 3.5 - 5.0 g/dL   AST 19 15 - 41 U/L   ALT 8 (L) 17 - 63 U/L   Alkaline Phosphatase 84 38 - 126 U/L   Total Bilirubin 2.2 (H) 0.3 - 1.2 mg/dL   GFR calc non Af Amer >60 >60 mL/min   GFR calc Af Amer >60 >60 mL/min    Comment: (NOTE) The eGFR has been calculated using the CKD EPI equation. This calculation has not been validated in all clinical situations. eGFR's persistently <60 mL/min signify possible Chronic Kidney Disease.    Anion gap 9 5 - 15  Ethanol     Status: None   Collection Time: 12/30/16  5:54 PM  Result Value Ref Range   Alcohol, Ethyl (B) <5 <5 mg/dL    Comment:        LOWEST DETECTABLE LIMIT FOR SERUM ALCOHOL IS 5 mg/dL FOR MEDICAL PURPOSES ONLY   CBC with Diff     Status: Abnormal   Collection Time: 12/30/16  5:54 PM  Result Value Ref Range   WBC 4.9 4.0 - 10.5 K/uL   RBC 4.78 4.22 - 5.81 MIL/uL   Hemoglobin 14.9 13.0 - 17.0 g/dL   HCT 41.3 39.0 - 52.0 %   MCV 86.4 78.0 - 100.0 fL   MCH 31.2 26.0 - 34.0 pg   MCHC 36.1 (H) 30.0 - 36.0 g/dL   RDW 12.2 11.5 - 15.5 %   Platelets 173 150 - 400 K/uL   Neutrophils Relative % 57 %   Neutro Abs 2.8 1.7 - 7.7 K/uL   Lymphocytes Relative 36 %   Lymphs Abs 1.8 0.7 - 4.0 K/uL   Monocytes Relative 6 %   Monocytes Absolute 0.3 0.1 - 1.0 K/uL   Eosinophils Relative 1 %   Eosinophils Absolute 0.0 0.0 - 0.7 K/uL   Basophils Relative 0 %   Basophils Absolute 0.0 0.0 - 0.1 K/uL  Acetaminophen level     Status: Abnormal   Collection Time: 12/30/16  5:54 PM  Result Value Ref Range   Acetaminophen (Tylenol), Serum <10 (L) 10 - 30 ug/mL    Comment:        THERAPEUTIC CONCENTRATIONS VARY SIGNIFICANTLY. A RANGE OF 10-30 ug/mL MAY BE AN EFFECTIVE CONCENTRATION FOR MANY PATIENTS. HOWEVER, SOME ARE BEST  TREATED AT CONCENTRATIONS OUTSIDE THIS RANGE. ACETAMINOPHEN  CONCENTRATIONS >150 ug/mL AT 4 HOURS AFTER INGESTION AND >50 ug/mL AT 12 HOURS AFTER INGESTION ARE OFTEN ASSOCIATED WITH TOXIC REACTIONS.   Salicylate level     Status: None   Collection Time: 12/30/16  5:54 PM  Result Value Ref Range   Salicylate Lvl <7.0 2.8 - 30.0 mg/dL    Current Facility-Administered Medications  Medication Dose Route Frequency Provider Last Rate Last Dose  . alum & mag hydroxide-simeth (MAALOX/MYLANTA) 200-200-20 MG/5ML suspension 30 mL  30 mL Oral Q6H PRN Sherwood Gambler, MD      . ibuprofen (ADVIL,MOTRIN) tablet 600 mg  600 mg Oral Q8H PRN Sherwood Gambler, MD      . nicotine (NICODERM CQ - dosed in mg/24 hours) patch 21 mg  21 mg Transdermal Daily Sherwood Gambler, MD      . ondansetron [facility]) tablet 4 mg  4 mg Oral Q8H PRN Sherwood Gambler, MD       Current Outpatient Prescriptions  Medication Sig Dispense Refill  . HYDROcodone-acetaminophen (NORCO/VICODIN) 5-325 MG tablet Take 1 tablet by mouth every 6 (six) hours as needed for severe pain. (Patient not taking: Reported on 12/30/2016) 8 tablet 0    Musculoskeletal: Strength & Muscle Tone: within normal limits Gait & Station: normal Patient leans: N/A  Psychiatric Specialty Exam: Physical Exam  Constitutional: He is oriented to person, place, and time. He appears well-developed and well-nourished.  HENT:  Head: Normocephalic.  Neck: Normal range of motion.  Respiratory: Effort normal.  Musculoskeletal: Normal range of motion.  Neurological: He is alert and oriented to person, place, and time.  Psychiatric: He has a normal mood and affect. His speech is normal and behavior is normal. Judgment and thought content normal. Cognition and memory are normal.    Review of Systems  All other systems reviewed and are negative.   Blood pressure (!) 129/56, pulse 79, temperature 98.5 F (36.9 C), temperature source Oral, resp. rate 17, height 5'  10" (1.778 m), weight 70.3 kg (155 lb), SpO2 97 %.Body mass index is 22.24 kg/m.  General Appearance: Casual  Eye Contact:  Good  Speech:  Normal Rate  Volume:  Normal  Mood:  Euthymic  Affect:  Congruent  Thought Process:  Coherent and Descriptions of Associations: Intact  Orientation:  Full (Time, Place, and Person)  Thought Content:  WDL and Logical  Suicidal Thoughts:  No  Homicidal Thoughts:  No  Memory:  Immediate;   Good Recent;   Good Remote;   Good  Judgement:  Fair  Insight:  Fair  Psychomotor Activity:  Normal  Concentration:  Concentration: Good and Attention Span: Good  Recall:  Good  Fund of Knowledge:  Good  Language:  Good  Akathisia:  No  Handed:  Right  AIMS (if indicated):     Assets:  Housing Leisure Time Physical Health Resilience Social Support Vocational/Educational  ADL's:  Intact  Cognition:  WNL  Sleep:        Treatment Plan Summary: Daily contact with patient to assess and evaluate symptoms and progress in treatment, Medication management and Plan adjustment disorder with mixed disturbance of emotions and conduct:] -Crisis stabilization -Medication management:  None started, not warranted -Individual counseling -Outpatient resources provided  Disposition: No evidence of imminent risk to self or others at present.    Waylan Boga, NP 12/31/2016 10:06 AM  Patient seen face-to-face for psychiatric evaluation, chart reviewed and case discussed with the physician extender and developed treatment plan. Reviewed the information documented and agree with the  treatment plan. Corena Pilgrim, MD

## 2016-12-31 NOTE — ED Notes (Signed)
Patient has slept the majority of shift. When he woke up briefly he was calm and cooperative. Given some water. He denies having any SI or HI toward anyone. Hopeful for discharge soon. No behavioral issues tonight.

## 2016-12-31 NOTE — BH Assessment (Signed)
[facility] Assessment Progress Note  Per Thedore MinsMojeed Akintayo, MD, this pt does not require psychiatric hospitalization at this time.  Pt presents under IVC initiated by his mother, which Dr Jannifer FranklinAkintayo has rescinded.  [facility]  He does not need any follow up resources.  Pt's nurse, Diane, has been notified.  Doylene Canninghomas Daneille Desilva, MA Triage Specialist [phone]

## 2016-12-31 NOTE — ED Notes (Signed)
Pt reports readiness to be discharged. He denies SI/HI/AVH. Reports having arguments with his mother and said that he will avoid arguing with her and [place].

## 2016-12-31 NOTE — [facility] Suicide Risk Assessment (Signed)
Suicide Risk Assessment  Discharge Assessment   [facility]   Principal Problem: Adjustment disorder with mixed disturbance of emotions and conduct Discharge Diagnoses:  Patient Active Problem List   Diagnosis Date Noted  . Adjustment disorder with mixed disturbance of emotions and conduct [F43.25] 12/31/2016    Priority: High    Total Time spent with patient: 45 minutes    Musculoskeletal: Strength & Muscle Tone: within normal limits Gait & Station: normal Patient leans: N/A  Psychiatric Specialty Exam: Physical Exam  Constitutional: He is oriented to person, place, and time. He appears well-developed and well-nourished.  HENT:  Head: Normocephalic.  Neck: Normal range of motion.  Respiratory: Effort normal.  Musculoskeletal: Normal range of motion.  Neurological: He is alert and oriented to person, place, and time.  Psychiatric: He has a normal mood and affect. His speech is normal and behavior is normal. Judgment and thought content normal. Cognition and memory are normal.    Review of Systems  All other systems reviewed and are negative.   Blood pressure (!) 129/56, pulse 79, temperature 98.5 F (36.9 C), temperature source Oral, resp. rate 17, height 5\' 10"  (1.778 m), weight 70.3 kg (155 lb), SpO2 97 %.Body mass index is 22.24 kg/m.  General Appearance: Casual  Eye Contact:  Good  Speech:  Normal Rate  Volume:  Normal  Mood:  Euthymic  Affect:  Congruent  Thought Process:  Coherent and Descriptions of Associations: Intact  Orientation:  Full (Time, Place, and Person)  Thought Content:  WDL and Logical  Suicidal Thoughts:  No  Homicidal Thoughts:  No  Memory:  Immediate;   Good Recent;   Good Remote;   Good  Judgement:  Fair  Insight:  Fair  Psychomotor Activity:  Normal  Concentration:  Concentration: Good and Attention Span: Good  Recall:  Good  Fund of Knowledge:  Good  Language:  Good  Akathisia:  No  Handed:  Right  AIMS  (if indicated):     Assets:  Housing Leisure Time Physical Health Resilience Social Support Vocational/Educational  ADL's:  Intact  Cognition:  WNL  Sleep:       Mental Status Per Nursing Assessment::   On Admission:   altercation with his mother who sent him to the ED  Demographic Factors:  Male and Adolescent or young adult  Loss Factors: NA  Historical Factors: NA  Risk Reduction Factors:   Sense of responsibility to family, Living with another person, especially a relative and Positive social support  Continued Clinical Symptoms:  None  Cognitive Features That Contribute To Risk:  None    Suicide Risk:  Minimal: No identifiable suicidal ideation.  Patients presenting with no risk factors but with morbid ruminations; may be classified as minimal risk based on the severity of the depressive symptoms    Plan Of Care/Follow-up recommendations:  Activity:  as tolerated Diet:  heart healthy diet  Nature Vogelsang, NP 12/31/2016, 10:11 AM

## 2016-12-31 NOTE — ED Notes (Signed)
Pt discharged home. Discharged instructions read to pt who verbalized understanding. All belongings returned to pt who signed for same. Denies SI/HI, is not delusional and not responding to internal stimuli. Escorted pt to the ED exit.    

## 2017-02-18 ENCOUNTER — Emergency Department (HOSPITAL_COMMUNITY)
Admission: EM | Admit: 2017-02-18 | Discharge: 2017-02-19 | Disposition: A | Discharge status: Home / Self Care | Payer: Self-pay | Attending: Emergency Medicine | Admitting: Emergency Medicine

## 2017-02-18 ENCOUNTER — Encounter (HOSPITAL_COMMUNITY): Payer: Self-pay

## 2017-02-18 DIAGNOSIS — Z008 Encounter for other general examination: Secondary | ICD-10-CM | POA: Insufficient documentation

## 2017-02-18 DIAGNOSIS — F1290 Cannabis use, unspecified, uncomplicated: Secondary | ICD-10-CM | POA: Insufficient documentation

## 2017-02-18 DIAGNOSIS — R4589 Other symptoms and signs involving emotional state: Secondary | ICD-10-CM | POA: Insufficient documentation

## 2017-02-18 DIAGNOSIS — J45909 Unspecified asthma, uncomplicated: Secondary | ICD-10-CM | POA: Insufficient documentation

## 2017-02-18 DIAGNOSIS — R45851 Suicidal ideations: Secondary | ICD-10-CM | POA: Insufficient documentation

## 2017-02-18 DIAGNOSIS — F4325 Adjustment disorder with mixed disturbance of emotions and conduct: Secondary | ICD-10-CM | POA: Diagnosis present

## 2017-02-18 DIAGNOSIS — R45850 Homicidal ideations: Secondary | ICD-10-CM | POA: Insufficient documentation

## 2017-02-18 LAB — COMPREHENSIVE METABOLIC PANEL
ALK PHOS: 83 U/L (ref 38–126)
ALT: 11 U/L — AB (ref 17–63)
AST: 20 U/L (ref 15–41)
Albumin: 4.8 g/dL (ref 3.5–5.0)
Anion gap: 12 (ref 5–15)
BUN: 21 mg/dL — AB (ref 6–20)
CALCIUM: 9.7 mg/dL (ref 8.9–10.3)
CHLORIDE: 105 mmol/L (ref 101–111)
CO2: 22 mmol/L (ref 22–32)
CREATININE: 1.04 mg/dL (ref 0.61–1.24)
Glucose, Bld: 72 mg/dL (ref 65–99)
Potassium: 3.9 mmol/L (ref 3.5–5.1)
SODIUM: 139 mmol/L (ref 135–145)
Total Bilirubin: 2.2 mg/dL — ABNORMAL HIGH (ref 0.3–1.2)
Total Protein: 7.7 g/dL (ref 6.5–8.1)

## 2017-02-18 LAB — RAPID URINE DRUG SCREEN, HOSP PERFORMED
AMPHETAMINES: NOT DETECTED
BENZODIAZEPINES: NOT DETECTED
Barbiturates: NOT DETECTED
COCAINE: NOT DETECTED
OPIATES: NOT DETECTED
Tetrahydrocannabinol: POSITIVE — AB

## 2017-02-18 LAB — CBC
HEMATOCRIT: 39.2 % (ref 39.0–52.0)
HEMOGLOBIN: 14.3 g/dL (ref 13.0–17.0)
MCH: 30.6 pg (ref 26.0–34.0)
MCHC: 36.5 g/dL — ABNORMAL HIGH (ref 30.0–36.0)
MCV: 83.9 fL (ref 78.0–100.0)
PLATELETS: 193 10*3/uL (ref 150–400)
RBC: 4.67 MIL/uL (ref 4.22–5.81)
RDW: 12 % (ref 11.5–15.5)
WBC: 5.6 10*3/uL (ref 4.0–10.5)

## 2017-02-18 LAB — ETHANOL: Alcohol, Ethyl (B): <5 mg/dL (ref <5)

## 2017-02-18 LAB — SALICYLATE LEVEL

## 2017-02-18 LAB — ACETAMINOPHEN LEVEL: Acetaminophen (Tylenol), Serum: <10 ug/mL — ABNORMAL LOW (ref 10–30)

## 2017-02-18 MED ORDER — ONDANSETRON HCL 4 MG PO TABS: 4.0000 mg | ORAL_TABLET | Freq: Three times a day (TID) | ORAL | Status: DC | PRN

## 2017-02-18 MED ORDER — ZOLPIDEM TARTRATE 5 MG PO TABS
5.0000 mg | ORAL_TABLET | Freq: Every evening | ORAL | Status: DC | PRN
  Administered 2017-02-18: 5 mg via ORAL
  Filled 2017-02-18: qty 1

## 2017-02-18 MED ORDER — ACETAMINOPHEN 325 MG PO TABS: 650.0000 mg | ORAL_TABLET | ORAL | Status: DC | PRN

## 2017-02-18 MED ORDER — ALUM & MAG HYDROXIDE-SIMETH 200-200-20 MG/5ML PO SUSP: 30.0000 mL | Freq: Four times a day (QID) | ORAL | Status: DC | PRN
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
## 2017-02-18 NOTE — ED Notes (Signed)
Pt here voluntarily expressing suicidal thoughts

## 2017-02-18 NOTE — ED Notes (Signed)
Pt says that he's not suppose to be on any medications that he knows of, he states when he was about 16 he was dx with depression Pt states he's been having bad thoughts and wants help
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
## 2017-02-18 NOTE — ED Provider Notes (Signed)
Patient's labs reassuring.  TTS has evaluated-- recommends IP placement.   Garlon HatchetSanders, Valoria Tamburri M, PA-C 02/18/17 2334    Melene PlanFloyd, Dan, DO 02/19/17 1235

## 2017-02-18 NOTE — ED Provider Notes (Signed)
[facility]-EMERGENCY DEPT Provider Note   CSN: [id number] Arrival date & time: 02/18/17  2152     History   Chief Complaint Chief Complaint  Patient presents with  . Suicidal    HPI Juan Chen is a 21 y.o. male with a PMHx of adjustment disorder, anxiety, depression, and asthma, who presents to the ED with complaints of constant suicidal ideations without a plan and homicidal ideations without a plan 1 week. Patient states he has been arguing with his family and this has upset him, causing his SI and HI. He denies AVH, drug use, alcohol use, and tobacco use. Denies any other medical complaints at this time, does not take any medications at home, and does not have a psychiatrist. Not on any psychiatric medications. He is here voluntarily requesting help with his SI and HI.   The history is provided by the patient and medical records. No language interpreter was used.  Mental Health Problem  Presenting symptoms: homicidal ideas and suicidal thoughts   Presenting symptoms: no hallucinations   Onset quality:  Gradual Duration:  1 week Timing:  Constant Progression:  Unchanged Chronicity:  Recurrent Context: stressful life event   Treatment compliance:  Untreated Relieved by:  None tried Worsened by:  Family interactions Ineffective treatments:  None tried Associated symptoms: no abdominal pain and no chest pain   Risk factors: hx of mental illness and recent psychiatric admission     Past Medical History:  Diagnosis Date  . Anxiety and depression   . Asthma     Patient Active Problem List   Diagnosis Date Noted  . 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 No pertinent surgical history.     Home Medications    Prior to Admission medications   Medication Sig Start Date End Date Taking? Authorizing Provider  HYDROcodone-acetaminophen (NORCO/VICODIN) 5-325 MG tablet Take 1 tablet by mouth every 6 (six) hours as  needed for severe pain. Patient not taking: Reported on 12/30/2016 09/10/16   Felicie Morn, NP    Family History History reviewed. No pertinent family history.  Social History Social History  Substance Use Topics  . Smoking status: Never Smoker  . Smokeless tobacco: Never Used  . Alcohol use No     Allergies   Penicillins   Review of Systems Review of Systems  Constitutional: Negative for chills and fever.  Respiratory: Negative for shortness of breath.   Cardiovascular: Negative for chest pain.  Gastrointestinal: Negative for abdominal pain, constipation, diarrhea, nausea and vomiting.  Genitourinary: Negative for dysuria and hematuria.  Musculoskeletal: Negative for arthralgias and myalgias.  Skin: Negative for color change.  Allergic/Immunologic: Negative for immunocompromised state.  Neurological: Negative for weakness and numbness.  Psychiatric/Behavioral: Positive for homicidal ideas and suicidal ideas. Negative for confusion and hallucinations.   All other systems reviewed and are negative for acute change except as noted in the HPI.    Physical Exam Updated Vital Signs BP 121/79 (BP Location: Left Arm)   Pulse 88   Temp 98.3 F (36.8 C) (Oral)   Resp 18   SpO2 97%   Physical Exam  Constitutional: He is oriented to person, place, and time. Vital signs are normal. He appears well-developed and well-nourished.  Non-toxic appearance. No distress.  Afebrile, nontoxic, NAD  HENT:  Head: Normocephalic and atraumatic.  Mouth/Throat: Oropharynx is clear and moist and mucous membranes are normal.  Eyes: Conjunctivae and EOM are normal. Right eye exhibits no discharge.  Left eye exhibits no discharge.  Neck: Normal range of motion. Neck supple.  Cardiovascular: Normal rate, regular rhythm, normal heart sounds and intact distal pulses.  Exam reveals no gallop and no friction rub.   No murmur heard. Pulmonary/Chest: Effort normal and breath sounds normal. No respiratory  distress. He has no decreased breath sounds. He has no wheezes. He has no rhonchi. He has no rales.  Abdominal: Soft. Normal appearance and bowel sounds are normal. He exhibits no distension. There is no tenderness. There is no rigidity, no rebound, no guarding, no CVA tenderness, no tenderness at McBurney's point and negative Murphy's sign.  Musculoskeletal: Normal range of motion.  Neurological: He is alert and oriented to person, place, and time. He has normal strength. No sensory deficit.  Skin: Skin is warm, dry and intact. No rash noted.  Psychiatric: He is not actively hallucinating. He exhibits a depressed mood. He expresses homicidal and suicidal ideation. He expresses no suicidal plans and no homicidal plans.  Depressed affect, but pleasant and cooperative. Endorsing SI and HI without a plan for either, denies AVH, doesn't seem to be responding to internal stimuli.   Nursing note and vitals reviewed.    ED Treatments / Results  Labs (all labs ordered are listed, but only abnormal results are displayed) Labs Reviewed  CBC - Abnormal; Notable for the following:       Result Value   MCHC 36.5 (*)    All other components within normal limits  RAPID URINE DRUG SCREEN, HOSP PERFORMED - Abnormal; Notable for the following:    Tetrahydrocannabinol POSITIVE (*)    All other components within normal limits  COMPREHENSIVE METABOLIC PANEL  ETHANOL  SALICYLATE LEVEL  ACETAMINOPHEN LEVEL    EKG  EKG Interpretation None       Radiology No results found.  Procedures Procedures (including critical care time)  Medications Ordered in ED Medications  acetaminophen (TYLENOL) tablet 650 mg (not administered)  zolpidem (AMBIEN) tablet 5 mg (not administered)  ondansetron (ZOFRAN) tablet 4 mg (not administered)  alum & mag hydroxide-simeth (MAALOX/MYLANTA) 200-200-20 MG/5ML suspension 30 mL (not administered)     Initial Impression / Assessment and Plan / ED Course  I have  reviewed the triage vital signs and the nursing notes.  Pertinent labs & imaging results that were available during my care of the patient were reviewed by me and considered in my medical decision making (see chart for details).     21 y.o. male here voluntarily for SI and HI, no plan for either, denies AVH, drug use, EtOH use, or tobacco use. No medical complaints. Exam benign aside from depressed affect. Will get clearance labs and TTS consult then reassess shortly.   11:09 PM UDS with +THC. CBC WNL. Remainder of labs pending. Patient care to be resumed by Sharilyn Sites, PA-C at shift change sign-out. Patient history has been discussed with midlevel resuming care. Please see their notes for further documentation of pending results and dispo/care. Pt stable at sign-out and updated on transfer of care.  Psych hold orders and home med orders placed. Please see TTS notes for further documentation of care/dispo. PLEASE NOTE THAT PT IS HERE VOLUNTARILY AT THIS TIME, IF PT TRIES TO LEAVE THEY WOULD NEED IVC PAPERWORK TAKEN OUT. Pt stable at time of med clearance.     Final Clinical Impressions(s) / ED Diagnoses   Final diagnoses:  Suicidal ideation  Homicidal ideation  Medical clearance for psychiatric admission  Marijuana use  New Prescriptions New Prescriptions   No medications on [address]     Kamee Bobst, [place], [place] 02/18/17 2309    Gwyneth SproutPlunkett, Whitney, MD 02/23/17 [phone]

## 2017-02-19 DIAGNOSIS — F4325 Adjustment disorder with mixed disturbance of emotions and conduct: Secondary | ICD-10-CM

## 2017-02-19 DIAGNOSIS — R45850 Homicidal ideations: Secondary | ICD-10-CM

## 2017-02-19 DIAGNOSIS — R45851 Suicidal ideations: Secondary | ICD-10-CM

## 2017-02-19 NOTE — Consult Note (Signed)
[facility] Face-to-Face Psychiatry Consult   Reason for Consult:  Homicidal and suicidal ideation Referring Physician:  EDP Patient Identification: Juan Chen MRN:  [id number] Principal Diagnosis: Adjustment disorder with mixed disturbance of emotions and conduct Diagnosis:   Patient Active Problem List   Diagnosis Date Noted  . Adjustment disorder with mixed disturbance of emotions and conduct [F43.25] 12/31/2016    Total Time spent with patient: 45 minutes  Subjective:   Juan Chen is a 21 y.o. male patient admitted with .  HPI:  Pt was seen and chart reviewed with Dr Darleene Cleaver. Pt stated he came to the hospital because he started feeling homicidal and suicidal ideation toward his mother because eshe was drinking and they were arguing. Today,  Pt denies suicidal/homicidal ideation, denies auditory/visual hallucinations and does not appear to be responding to internal stimuli. Pt was ssen in the [facility] last week for the same issue and referred to [facility] for therapy and medication management but failed to follow up. Pt was calm and cooperative, alert & oriented x 4, and appropriate for the situation. Pt is psychiatrically clear for discharge.   Past Psychiatric History: As above  Risk to Self: None Risk to Others: None Prior Inpatient Therapy: Prior Inpatient Therapy: Yes Prior Therapy Dates: 2017 Prior Therapy Facilty/Provider(s): [place], [place] Reason for Treatment: Derpession  Prior Outpatient Therapy: Prior Outpatient Therapy: No Prior Therapy Dates: NA Prior Therapy Facilty/Provider(s): NA Reason for Treatment: NA Does patient have an ACCT team?: No Does patient have Intensive In-House Services?  : No Does patient have [facility] services? : No Does patient have [facility] services?: No  Past Medical History:  Past Medical History:  Diagnosis Date  . Anxiety and depression   . Asthma    History reviewed. No pertinent surgical history. Family History: History reviewed. No  pertinent family history. Family Psychiatric  History: Unknown Social History:  History  Alcohol Use No     History  Drug Use No    Social History   Social History  . Marital status: Single    Spouse name: N/A  . Number of children: N/A  . Years of education: N/A   Social History Main Topics  . Smoking status: Never Smoker  . Smokeless tobacco: Never Used  . Alcohol use No  . Drug use: No  . Sexual activity: Not Asked   Other Topics Concern  . None   Social History Narrative  . None   Additional Social History:    Allergies:   Allergies  Allergen Reactions  . Penicillins Swelling    Has patient had a PCN reaction causing immediate rash, facial/tongue/throat swelling, SOB or lightheadedness with hypotension: Unknown Has patient had a PCN reaction causing severe rash involving mucus membranes or skin necrosis: Unknown Has patient had a PCN reaction that required hospitalization: Unknown Has patient had a PCN reaction occurring within the last 10 years: No If all of the above answers are "NO", then may proceed with Cephalosporin use.     Labs:  Results for orders placed or performed during the hospital encounter of 02/18/17 (from the past 48 hour(s))  Comprehensive metabolic panel     Status: Abnormal   Collection Time: 02/18/17  9:57 PM  Result Value Ref Range   Sodium 139 135 - 145 mmol/L   Potassium 3.9 3.5 - 5.1 mmol/L   Chloride 105 101 - 111 mmol/L   CO2 22 22 - 32 mmol/L   Glucose, Bld 72 65 - 99 mg/dL  BUN 21 (H) 6 - 20 mg/dL   Creatinine, Ser 1.04 0.61 - 1.24 mg/dL   Calcium 9.7 8.9 - 10.3 mg/dL   Total Protein 7.7 6.5 - 8.1 g/dL   Albumin 4.8 3.5 - 5.0 g/dL   AST 20 15 - 41 U/L   ALT 11 (L) 17 - 63 U/L   Alkaline Phosphatase 83 38 - 126 U/L   Total Bilirubin 2.2 (H) 0.3 - 1.2 mg/dL   GFR calc non Af Amer >60 >60 mL/min   GFR calc Af Amer >60 >60 mL/min    Comment: (NOTE) The eGFR has been calculated using the CKD EPI equation. This  calculation has not been validated in all clinical situations. eGFR's persistently <60 mL/min signify possible Chronic Kidney Disease.    Anion gap 12 5 - 15  Ethanol     Status: None   Collection Time: 02/18/17  9:57 PM  Result Value Ref Range   Alcohol, Ethyl (B) <5 <5 mg/dL    Comment:        LOWEST DETECTABLE LIMIT FOR SERUM ALCOHOL IS 5 mg/dL FOR MEDICAL PURPOSES ONLY   Salicylate level     Status: None   Collection Time: 02/18/17  9:57 PM  Result Value Ref Range   Salicylate Lvl <5.6 2.8 - 30.0 mg/dL  Acetaminophen level     Status: Abnormal   Collection Time: 02/18/17  9:57 PM  Result Value Ref Range   Acetaminophen (Tylenol), Serum <10 (L) 10 - 30 ug/mL    Comment:        THERAPEUTIC CONCENTRATIONS VARY SIGNIFICANTLY. A RANGE OF 10-30 ug/mL MAY BE AN EFFECTIVE CONCENTRATION FOR MANY PATIENTS. HOWEVER, SOME ARE BEST TREATED AT CONCENTRATIONS OUTSIDE THIS RANGE. ACETAMINOPHEN CONCENTRATIONS >150 ug/mL AT 4 HOURS AFTER INGESTION AND >50 ug/mL AT 12 HOURS AFTER INGESTION ARE OFTEN ASSOCIATED WITH TOXIC REACTIONS.   cbc     Status: Abnormal   Collection Time: 02/18/17  9:57 PM  Result Value Ref Range   WBC 5.6 4.0 - 10.5 K/uL   RBC 4.67 4.22 - 5.81 MIL/uL   Hemoglobin 14.3 13.0 - 17.0 g/dL   HCT 39.2 39.0 - 52.0 %   MCV 83.9 78.0 - 100.0 fL   MCH 30.6 26.0 - 34.0 pg   MCHC 36.5 (H) 30.0 - 36.0 g/dL   RDW 12.0 11.5 - 15.5 %   Platelets 193 150 - 400 K/uL  Rapid urine drug screen (hospital performed)     Status: Abnormal   Collection Time: 02/18/17  9:57 PM  Result Value Ref Range   Opiates NONE DETECTED NONE DETECTED   Cocaine NONE DETECTED NONE DETECTED   Benzodiazepines NONE DETECTED NONE DETECTED   Amphetamines NONE DETECTED NONE DETECTED   Tetrahydrocannabinol POSITIVE (A) NONE DETECTED   Barbiturates NONE DETECTED NONE DETECTED    Comment:        DRUG SCREEN FOR MEDICAL PURPOSES ONLY.  IF CONFIRMATION IS NEEDED FOR ANY PURPOSE, NOTIFY LAB WITHIN 5  DAYS.        LOWEST DETECTABLE LIMITS FOR URINE DRUG SCREEN Drug Class       Cutoff (ng/mL) Amphetamine      1000 Barbiturate      200 Benzodiazepine   387 Tricyclics       564 Opiates          300 Cocaine          300 THC              50  Current Facility-Administered Medications  Medication Dose Route Frequency Provider Last Rate Last Dose  . acetaminophen (TYLENOL) tablet 650 mg  650 mg Oral Q4H PRN Street, [place], [place]      . alum & mag hydroxide-simeth (MAALOX/MYLANTA) 200-200-20 MG/5ML suspension 30 mL  30 mL Oral Q6H PRN Street, [place], [place]      . ondansetron [facility]) tablet 4 mg  4 mg Oral Q8H PRN Street, [place], [place]      . zolpidem (AMBIEN) tablet 5 mg  5 mg Oral QHS PRN Street, [place], [place]   5 mg at 02/18/17 2321   Current Outpatient Prescriptions  Medication Sig Dispense Refill  . HYDROcodone-acetaminophen (NORCO/VICODIN) 5-325 MG tablet Take 1 tablet by mouth every 6 (six) hours as needed for severe pain. (Patient not taking: Reported on 12/30/2016) 8 tablet 0    Musculoskeletal: Strength & Muscle Tone: within normal limits Gait & Station: normal Patient leans: N/A  Psychiatric Specialty Exam: Physical Exam  Constitutional: He is oriented to person, place, and time. He appears well-developed and well-nourished.  HENT:  Head: Normocephalic.  Respiratory: Effort normal.  Musculoskeletal: Normal range of motion.  Neurological: He is alert and oriented to person, place, and time.  Psychiatric: His speech is normal and behavior is normal. Thought content normal. Cognition and memory are normal. He expresses impulsivity. He exhibits a depressed mood.    Review of Systems  Psychiatric/Behavioral: Positive for depression and substance abuse. Negative for hallucinations, memory loss and suicidal ideas. The patient is not nervous/anxious and does not have insomnia.   All other systems reviewed and are negative.   Blood pressure (!) 84/55, pulse 74,  temperature 97.7 F (36.5 C), temperature source Oral, resp. rate 18, SpO2 99 %.There is no height or weight on file to calculate BMI.  General Appearance: Casual  Eye Contact:  Good  Speech:  Clear and Coherent and Normal Rate  Volume:  Normal  Mood:  Depressed  Affect:  Congruent  Thought Process:  Coherent, Goal Directed and Linear  Orientation:  Full (Time, Place, and Person)  Thought Content:  Logical  Suicidal Thoughts:  No  Homicidal Thoughts:  No  Memory:  Immediate;   Good Recent;   Good Remote;   Fair  Judgement:  Fair  Insight:  Fair  Psychomotor Activity:  Normal  Concentration:  Concentration: Good and Attention Span: Good  Recall:  Good  Fund of Knowledge:  Good  Language:  Good  Akathisia:  No  Handed:  Right  AIMS (if indicated):     Assets:  Agricultural consultant Housing Physical Health Resilience Social Support  ADL's:  Intact  Cognition:  WNL  Sleep:        Treatment Plan Summary: Plan Adjustment disorder  Discharge Home Follow up with [facility] for medication management and therapy Avoid the use of alcohol and illicit drugs  Disposition: No evidence of imminent risk to self or others at present.   Patient does not meet criteria for psychiatric inpatient admission. Supportive therapy provided about ongoing stressors. Discussed crisis plan, support from social network, calling 911, coming to the Emergency Department, and calling Suicide Hotline.  Ethelene Hal, NP 02/19/2017 12:59 PM  Patient seen face-to-face for psychiatric evaluation, chart reviewed and case discussed with the physician extender and developed treatment plan. Reviewed the information documented and agree with the treatment plan. Corena Pilgrim, MD

## 2017-02-19 NOTE — Discharge Instructions (Signed)
For your ongoing mental health needs, you are advised to follow up with [facility].  New and returning patients are seen at their walk-in clinic.  Walk-in hours are Monday - Friday from 8:00 am - 3:00 pm.  Walk-in patients are seen on a first come, first served basis.  Try to arrive as early as possible for he best chance of being seen the same day: ° °     [facility] °     [address] °     [place],  [address] °     [phone] °

## 2017-02-19 NOTE — BH Assessment (Addendum)
[facility] Assessment Progress Note  Per Mojeed Akintayo, MD, this pt does not require psychiatric hospitalization at this time.  Pt is to be discharged from [facility] with recommendation to follow up with [facility].  This has been included in pt's discharge instructions.  Pt's nurse, Edie, has been notified.  Ame Heagle, MA Triage Specialist [phone]     

## 2017-02-19 NOTE — [facility] Suicide Risk Assessment (Signed)
Suicide Risk Assessment  Discharge Assessment   [facility]   Principal Problem: Adjustment disorder with mixed disturbance of emotions and conduct Discharge Diagnoses:  Patient Active Problem List   Diagnosis Date Noted  . Adjustment disorder with mixed disturbance of emotions and conduct [F43.25] 12/31/2016    Total Time spent with patient: 45 minutes  Musculoskeletal: Strength & Muscle Tone: within normal limits Gait & Station: normal Patient leans: N/A  Psychiatric Specialty Exam: Physical Exam  Constitutional: He is oriented to person, place, and time. He appears well-developed and well-nourished.  HENT:  Head: Normocephalic.  Respiratory: Effort normal.  Musculoskeletal: Normal range of motion.  Neurological: He is alert and oriented to person, place, and time.  Psychiatric: His speech is normal and behavior is normal. Thought content normal. Cognition and memory are normal. He expresses impulsivity. He exhibits a depressed mood.   Review of Systems  Psychiatric/Behavioral: Positive for depression and substance abuse. Negative for hallucinations, memory loss and suicidal ideas. The patient is not nervous/anxious and does not have insomnia.   All other systems reviewed and are negative.  Blood pressure (!) 84/55, pulse 74, temperature 97.7 F (36.5 C), temperature source Oral, resp. rate 18, SpO2 99 %.There is no height or weight on file to calculate BMI. General Appearance: Casual Eye Contact:  Good Speech:  Clear and Coherent and Normal Rate Volume:  Normal Mood:  Depressed Affect:  Congruent Thought Process:  Coherent, Goal Directed and Linear Orientation:  Full (Time, Place, and Person) Thought Content:  Logical Suicidal Thoughts:  No Homicidal Thoughts:  No Memory:  Immediate;   Good Recent;   Good Remote;   Fair Judgement:  Fair Insight:  Fair Psychomotor Activity:  Normal Concentration:  Concentration: Good and Attention Span:  Good Recall:  Good Fund of Knowledge:  Good Language:  Good Akathisia:  No Handed:  Right AIMS (if indicated):    Assets:  ArchitectCommunication Skills Financial Resources/Insurance Housing Physical Health Resilience Social Support ADL's:  Intact Cognition:  WNL  Mental Status Per Nursing Assessment::   On Admission:   suicidal and homicidal  Demographic Factors:  Male, Low socioeconomic status and Unemployed  Loss Factors: Financial problems/change in socioeconomic status  Historical Factors: Impulsivity  Risk Reduction Factors:   Sense of responsibility to family and Living with another person, especially a relative  Continued Clinical Symptoms:  Depression:   Impulsivity Alcohol/Substance Abuse/Dependencies  Cognitive Features That Contribute To Risk:  Closed-mindedness    Suicide Risk:  Minimal: No identifiable suicidal ideation.  Patients presenting with no risk factors but with morbid ruminations; may be classified as minimal risk based on the severity of the depressive symptoms    Plan Of Care/Follow-up recommendations:  Activity:  as tolerated Diet:  Heart Healthy  Laveda AbbeLaurie Britton Perfecto Purdy, NP 02/19/2017, 1:06 PM

## 2017-02-19 NOTE — ED Notes (Signed)
Pt discharged ambulatory with discharge instructions.  Bus pass was given and all belongings were returned to pt.

## 2017-11-03 ENCOUNTER — Encounter (HOSPITAL_COMMUNITY): Payer: Self-pay | Admitting: Emergency Medicine

## 2017-11-03 ENCOUNTER — Other Ambulatory Visit: Payer: Self-pay

## 2017-11-03 ENCOUNTER — Emergency Department (HOSPITAL_COMMUNITY)
Admission: EM | Admit: 2017-11-03 | Discharge: 2017-11-03 | Disposition: A | Discharge status: Home / Self Care | Payer: Self-pay | Attending: Emergency Medicine | Admitting: Emergency Medicine

## 2017-11-03 DIAGNOSIS — R369 Urethral discharge, unspecified: Secondary | ICD-10-CM

## 2017-11-03 DIAGNOSIS — J45909 Unspecified asthma, uncomplicated: Secondary | ICD-10-CM | POA: Insufficient documentation

## 2017-11-03 DIAGNOSIS — N4889 Other specified disorders of penis: Secondary | ICD-10-CM | POA: Insufficient documentation

## 2017-11-03 LAB — URINALYSIS, ROUTINE W REFLEX MICROSCOPIC
BILIRUBIN URINE: NEGATIVE
Bacteria, UA: NONE SEEN
GLUCOSE, UA: NEGATIVE mg/dL
HGB URINE DIPSTICK: NEGATIVE
Ketones, ur: 5 mg/dL — AB
NITRITE: NEGATIVE
Protein, ur: NEGATIVE mg/dL
SPECIFIC GRAVITY, URINE: 1.029 (ref 1.005–1.030)
WBC, UA: >50 WBC/hpf — ABNORMAL HIGH (ref 0–5)
pH: 6 (ref 5.0–8.0)

## 2017-11-03 MED ORDER — CEFTRIAXONE SODIUM 250 MG IJ SOLR
250.0000 mg | Freq: Once | INTRAMUSCULAR | Status: AC
  Administered 2017-11-03: 250 mg via INTRAMUSCULAR
  Filled 2017-11-03: qty 250

## 2017-11-03 MED ORDER — AZITHROMYCIN 250 MG PO TABS
1000.0000 mg | ORAL_TABLET | Freq: Once | ORAL | Status: AC
Start: 2017-11-03 — End: 2017-11-03
  Administered 2017-11-03: 1000 mg via ORAL
  Filled 2017-11-03: qty 4

## 2017-11-03 MED ORDER — LIDOCAINE HCL (PF) 1 % IJ SOLN
INTRAMUSCULAR | Status: AC
  Administered 2017-11-03: 5 mL
  Filled 2017-11-03: qty 5

## 2017-11-03 NOTE — Discharge Instructions (Signed)
°  Use a condom with every sexual encounter Follow up with your doctor or the health department if your symptoms do not improve.  Please return to the ER for fevers, vomiting, new or worsening symptoms, any additional concerns.   You have been tested for chlamydia and gonorrhea. These results will be available in approximately 3 days. You will be notified if they are positive.

## 2017-11-03 NOTE — ED Triage Notes (Signed)
Per Pt: c/o of white discharge and dysuria starting this morning.

## 2017-11-03 NOTE — ED Provider Notes (Signed)
[facility] EMERGENCY DEPARTMENT Provider Note   CSN: [id number] Arrival date & time: 11/03/17  1644     History   Chief Complaint Chief Complaint  Patient presents with  . SEXUALLY TRANSMITTED DISEASE    HPI Juan Chen is a 22 y.o. male.  The history is provided by the patient and medical records. No language interpreter was used.   Juan Chen is a 22 y.o. male since the emergency department complaining of dysuria and white penile discharge which began this morning.  Denies any abdominal pain, nausea, vomiting or back pain.  No fever or chills.  Denies history of similar.  He did have recent unprotected intercourse.  No known STD exposures.  No medications taken prior to arrival for symptoms.  He denies any scrotal swelling or other GU lesions.  Past Medical History:  Diagnosis Date  . Anxiety and depression   . Asthma     Patient Active Problem List   Diagnosis Date Noted  . 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 No pertinent surgical history.      Home Medications    Prior to Admission medications   Not on File    Family History No family history on file.  Social History Social History   Tobacco Use  . Smoking status: Never Smoker  . Smokeless tobacco: Never Used  Substance Use Topics  . Alcohol use: No  . Drug use: No     Allergies   Penicillins   Review of Systems Review of Systems  Constitutional: Negative for chills and fever.  Gastrointestinal: Negative for abdominal pain, nausea and vomiting.  Genitourinary: Positive for discharge and dysuria. Negative for frequency, penile pain, penile swelling, scrotal swelling, testicular pain and urgency.  Musculoskeletal: Negative for back pain.     Physical Exam Updated Vital Signs BP 103/71 (BP Location: Right Arm)   Pulse 77   Temp 98.8 F (37.1 C) (Oral)   Resp 16   Ht  (1.702 m)   Wt 70.3 kg  (155 lb)   SpO2 98%   BMI 24.28 kg/m   Physical Exam  Constitutional: He appears well-developed and well-nourished. No distress.  HENT:  Head: Normocephalic and atraumatic.  Neck: Neck supple.  Cardiovascular: Normal rate, regular rhythm and normal heart sounds.  No murmur heard. Pulmonary/Chest: Effort normal and breath sounds normal. No respiratory distress. He has no wheezes. He has no rales.  Abdominal:  No abdominal tenderness.  Genitourinary:  Genitourinary Comments: Chaperone present for exam.  No discharge from penis noted - swabbed for G&C. No signs of lesion or erythema on the penis or testicles. The penis and testicles are nontender. No testicular masses or swelling. No signs of any inguinal hernias. Cremaster reflex present bilaterally.  Neurological: He is alert.  Skin: Skin is warm and dry.  Nursing note and vitals reviewed.    ED Treatments / Results  Labs (all labs ordered are listed, but only abnormal results are displayed) Labs Reviewed  URINALYSIS, ROUTINE W REFLEX MICROSCOPIC - Abnormal; Notable for the following components:      Result Value   APPearance HAZY (*)    Ketones, ur 5 (*)    Leukocytes, UA MODERATE (*)    WBC, UA >50 (*)    All other components within normal limits  GC/CHLAMYDIA PROBE AMP ([facility]) NOT AT [facility]    EKG None  Radiology No results found.  Procedures Procedures (  including critical care time)  Medications Ordered in ED Medications  azithromycin (ZITHROMAX) tablet 1,000 mg (1,000 mg Oral Given 11/03/17 1814)  cefTRIAXone (ROCEPHIN) injection 250 mg (250 mg Intramuscular Given 11/03/17 1814)  lidocaine (PF) (XYLOCAINE) 1 % injection (5 mLs  Given 11/03/17 1815)     Initial Impression / Assessment and Plan / ED Course  I have reviewed the triage vital signs and the nursing notes.  Pertinent labs & imaging results that were available during my care of the patient were reviewed by me and considered in my medical decision  making (see chart for details).    Juan Chen is a young, healthy 22 y.o. male who presents to ED for penile discharge which began today associated with dysuria. He is concerned for STD. Benign GU exam. No testicular/scrotal tenderness to palpation or swelling. Afebrile with no abdominal pain or penile lesions. G&C sent - patient informed he will be notified if results are positive. Patient prophylactically treated for gonorrhea and chlamydia. All questions answered.   Final Clinical Impressions(s) / ED Diagnoses   Final diagnoses:  Penile discharge    ED Discharge Orders    None       Roselinda Bahena, Chase Picket, PA-C 11/04/17 0117    Donnetta Hutching, MD 11/06/17 [phone]

## 2017-11-03 NOTE — ED Notes (Signed)
Pt verbalized understanding discharge instructions and denies any further needs or questions at this time. VS stable, ambulatory and steady gait.   

## 2017-11-04 LAB — GC/CHLAMYDIA PROBE AMP (~~LOC~~) NOT AT ARMC
CHLAMYDIA, DNA PROBE: POSITIVE — AB
Neisseria Gonorrhea: POSITIVE — AB

## 2018-06-26 IMAGING — DX DG HAND COMPLETE 3+V*R*
3 series · 3 of 3 positions shown · non-contrast
Comparison: None.

CLINICAL DATA: Pain across the third through fifth metacarpal
phalangeal joints after punching wall.

EXAM:
RIGHT HAND - COMPLETE 3+ VIEW

[x hand pa right]
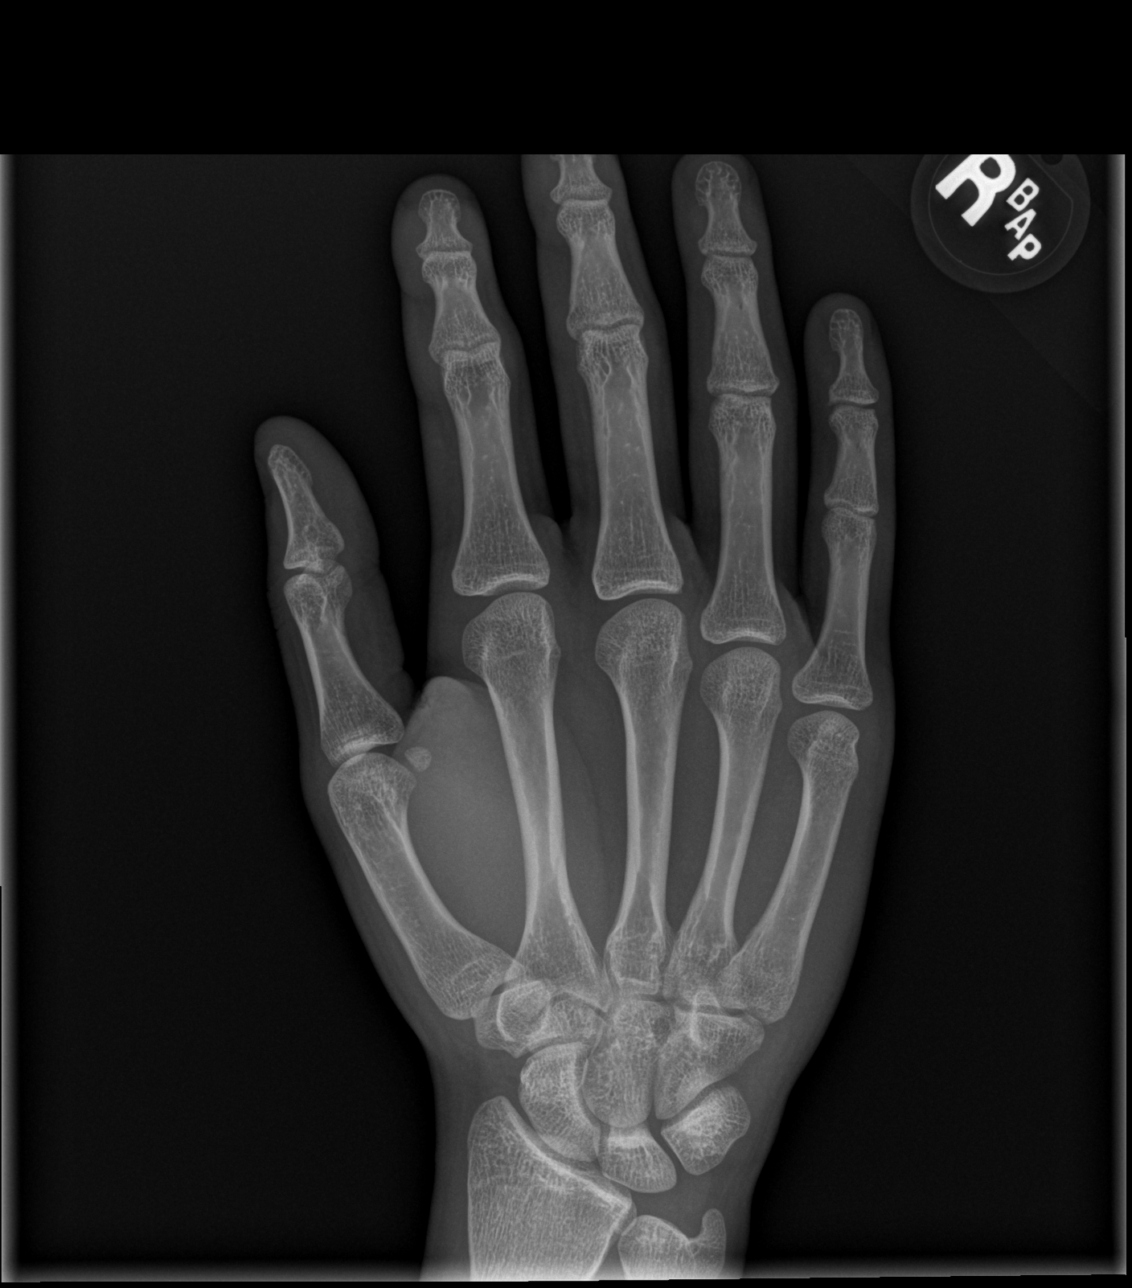

[x hand obl right]
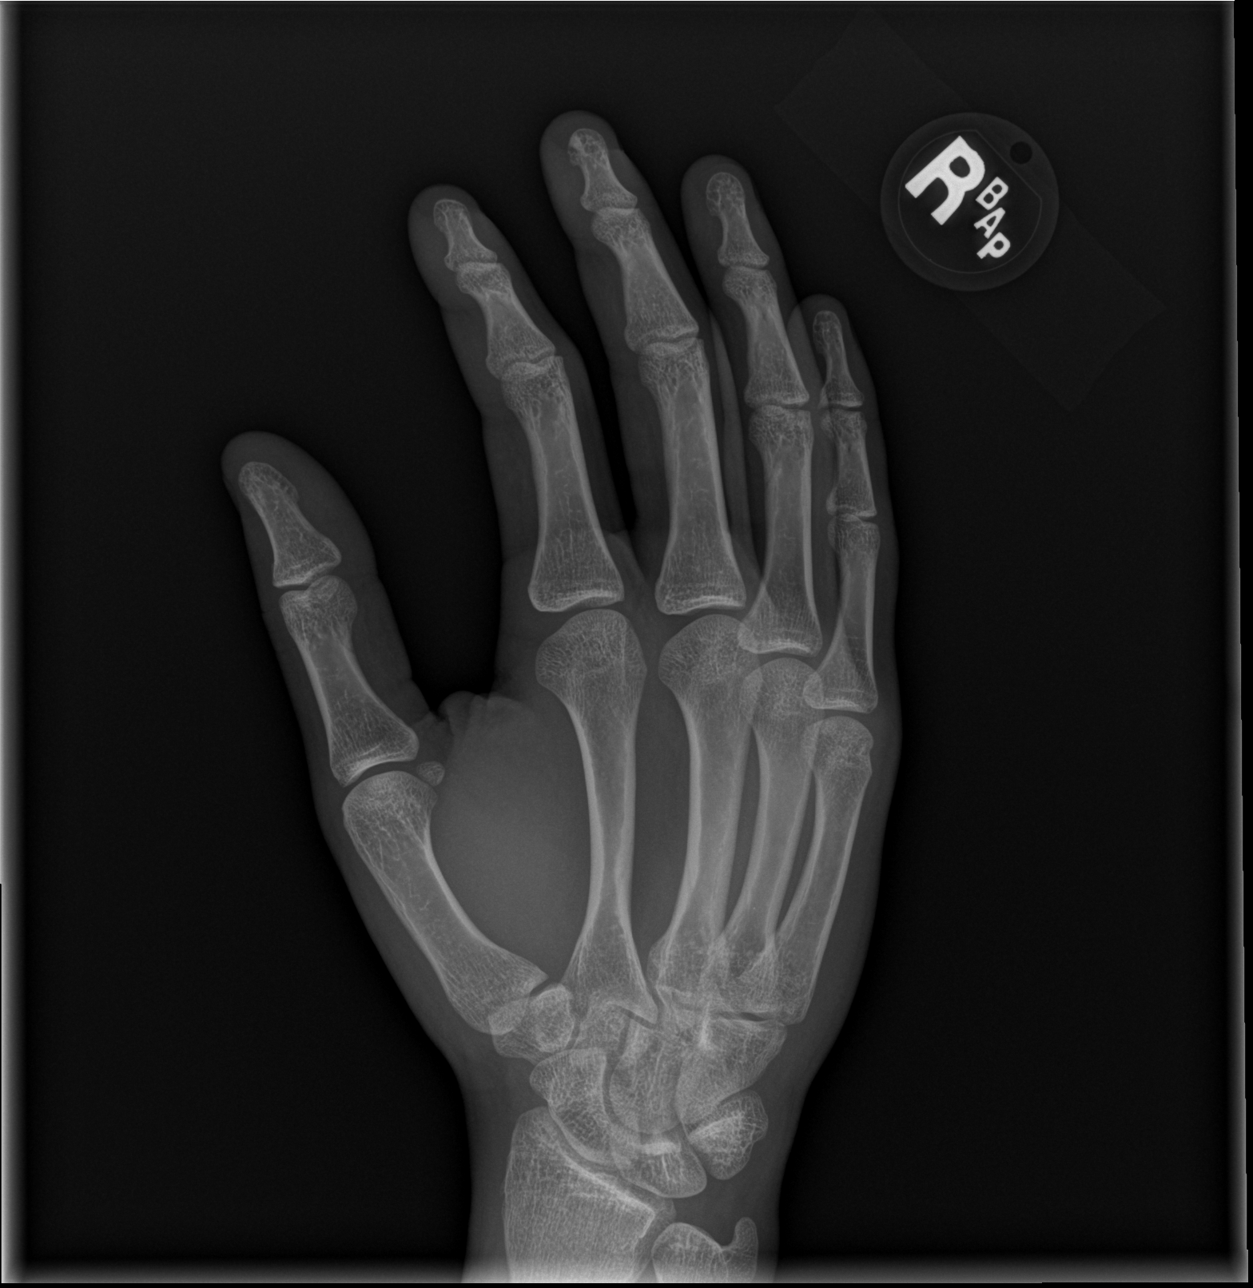

[x hand lat right]
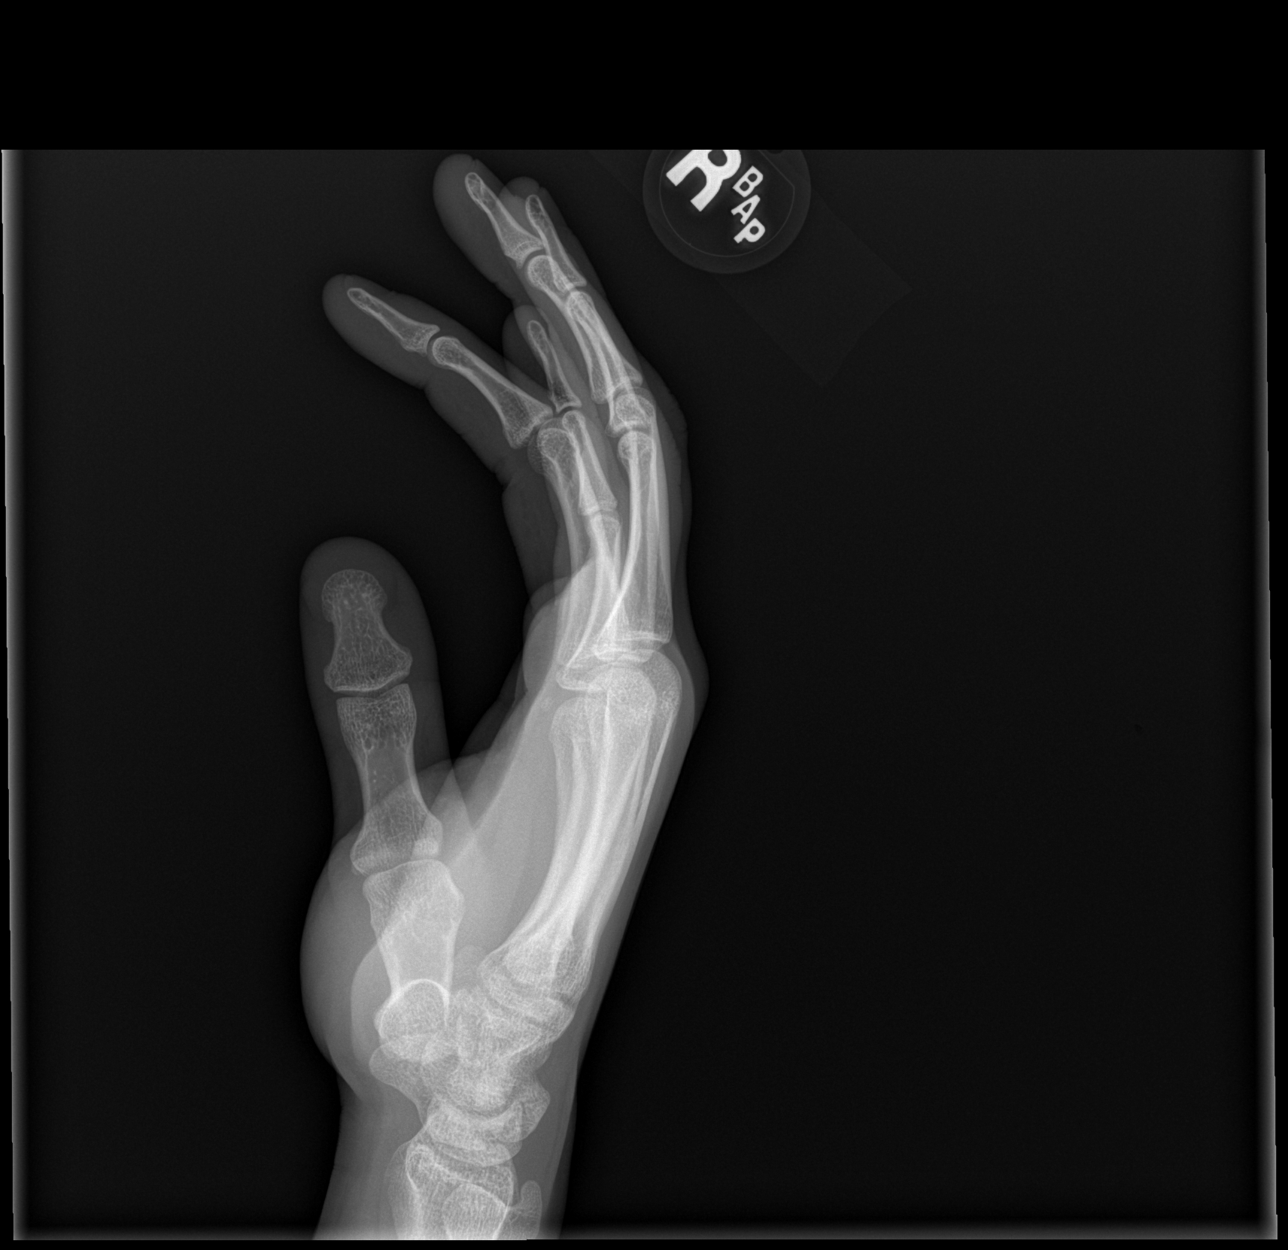

[3 of 3 positions shown; findings below may reference images not displayed]

FINDINGS: There is no evidence of fracture or dislocation. Carpal rows are
maintained. Mild soft tissue swelling over the MCP joints. There is
no evidence of arthropathy. Tiny sclerotic density involving the
tuft of the third distal phalanx consistent with a bone island.
IMPRESSION: Mild soft tissue swelling involving the dorsum of the hand at the
level of the MCP. No acute fracture identified.

## 2020-09-28 ENCOUNTER — Other Ambulatory Visit: Payer: Self-pay

## 2020-09-28 ENCOUNTER — Emergency Department (HOSPITAL_COMMUNITY)
Admission: EM | Admit: 2020-09-28 | Discharge: 2020-09-28 | Disposition: A | Discharge status: Home / Self Care | Payer: Self-pay | Attending: Emergency Medicine | Admitting: Emergency Medicine

## 2020-09-28 ENCOUNTER — Encounter (HOSPITAL_COMMUNITY): Payer: Self-pay | Admitting: Emergency Medicine

## 2020-09-28 DIAGNOSIS — J45909 Unspecified asthma, uncomplicated: Secondary | ICD-10-CM | POA: Insufficient documentation

## 2020-09-28 DIAGNOSIS — K0889 Other specified disorders of teeth and supporting structures: Secondary | ICD-10-CM | POA: Insufficient documentation

## 2020-09-28 MED ORDER — CLINDAMYCIN HCL 150 MG PO CAPS: 300.0000 mg | ORAL_CAPSULE | Freq: Three times a day (TID) | ORAL | 0 refills | Status: AC

## 2020-09-28 MED ORDER — CLINDAMYCIN HCL 150 MG PO CAPS
300.0000 mg | ORAL_CAPSULE | Freq: Once | ORAL | Status: AC
  Administered 2020-09-28: 300 mg via ORAL
  Filled 2020-09-28: qty 2

## 2020-09-28 MED ORDER — IBUPROFEN 600 MG PO TABS: 600.0000 mg | ORAL_TABLET | Freq: Four times a day (QID) | ORAL | 0 refills | Status: AC | PRN

## 2020-09-28 MED ORDER — IBUPROFEN 400 MG PO TABS
600.0000 mg | ORAL_TABLET | Freq: Once | ORAL | Status: AC
  Administered 2020-09-28: 600 mg via ORAL
  Filled 2020-09-28: qty 1

## 2020-09-28 NOTE — ED Provider Notes (Signed)
[facility] EMERGENCY DEPARTMENT Provider Note   CSN: [id number] Arrival date & time: 09/28/20  0538     History Chief Complaint  Patient presents with  . Dental Pain    Juan Chen is a 25 y.o. male.  Patient to ED by EMS for dental pain x 2 says. Pain is in the upper left forward molar. No fever or facial swelling. He states Tylenol did not relieve his pain. He states he called a dentist but "they wouldn't do nothing for me'.   The history is provided by the patient. No language interpreter was used.  Dental Pain Associated symptoms: no facial swelling and no fever        Past Medical History:  Diagnosis Date  . Anxiety and depression   . Asthma     Patient Active Problem List   Diagnosis Date Noted  . 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 No pertinent surgical history.     No family history on file.  Social History   Tobacco Use  . Smoking status: Never Smoker  . Smokeless tobacco: Never Used  Substance Use Topics  . Alcohol use: No  . Drug use: No    Home Medications Prior to Admission medications   Not on File    Allergies    Penicillins  Review of Systems   Review of Systems  Constitutional: Negative for fever.  HENT: Positive for dental problem. Negative for facial swelling.   Gastrointestinal: Negative for nausea.    Physical Exam Updated Vital Signs BP 133/75 (BP Location: Left Arm)   Pulse (!) 55   Temp 98.3 F (36.8 C) (Oral)   Resp 16   Ht 5\' 11"  (1.803 m)   Wt 80 kg   SpO2 99%   BMI 24.60 kg/m   Physical Exam Constitutional:      Appearance: He is well-developed.  HENT:     Head:     Comments: No facial swelling.    Mouth/Throat:     Comments: No visualized abscess. No significant dental caries or fractures. Oropharynx benign.  Pulmonary:     Effort: Pulmonary effort is normal.  Musculoskeletal:        General: Normal range of  motion.     Cervical back: Normal range of motion.  Skin:    General: Skin is warm and dry.  Neurological:     Mental Status: He is alert and oriented to person, place, and time.     ED Results / Procedures / Treatments   Labs (all labs ordered are listed, but only abnormal results are displayed) Labs Reviewed - No data to display  EKG None  Radiology No results found.  Procedures Procedures   Medications Ordered in ED Medications - No data to display  ED Course  I have reviewed the triage vital signs and the nursing notes.  Pertinent labs & imaging results that were available during my care of the patient were reviewed by me and considered in my medical decision making (see chart for details).    MDM Rules/Calculators/A&P                          Patient to ED with dental pain.   Will start clinda, ibuprofen, refer to dentistry.  Final Clinical Impression(s) / ED Diagnoses Final diagnoses:  None   1. Dental pain  Rx / DC Orders ED Discharge Orders  None       Elpidio Anis, PA-C 09/28/20 7169    Glynn Octave, MD 09/28/20 [phone]

## 2020-09-28 NOTE — ED Triage Notes (Signed)
Patient reports worsening left upper dental pain with headache onset last week .
